# Patient Record
Sex: Female | Born: 1985 | Hispanic: Yes | Marital: Married | Smoking: Never smoker
Health system: Southern US, Community
[De-identification: ages and names within clinical notes are randomized; demographics above are authoritative.]

## PROBLEM LIST (undated history)

## (undated) DIAGNOSIS — Z789 Other specified health status: Secondary | ICD-10-CM

## (undated) DIAGNOSIS — O24419 Gestational diabetes mellitus in pregnancy, unspecified control: Secondary | ICD-10-CM

## (undated) HISTORY — DX: Gestational diabetes mellitus in pregnancy, unspecified control: O24.419

---

## 2015-01-04 ENCOUNTER — Encounter: Payer: Self-pay | Admitting: Emergency Medicine

## 2015-01-04 ENCOUNTER — Emergency Department
Admission: EM | Admit: 2015-01-04 | Discharge: 2015-01-05 | Disposition: A | Discharge status: Home / Self Care | Payer: PRIVATE HEALTH INSURANCE | Attending: Emergency Medicine | Admitting: Emergency Medicine

## 2015-01-04 DIAGNOSIS — Z3202 Encounter for pregnancy test, result negative: Secondary | ICD-10-CM | POA: Insufficient documentation

## 2015-01-04 DIAGNOSIS — R112 Nausea with vomiting, unspecified: Secondary | ICD-10-CM | POA: Insufficient documentation

## 2015-01-04 DIAGNOSIS — N390 Urinary tract infection, site not specified: Secondary | ICD-10-CM

## 2015-01-04 DIAGNOSIS — R1030 Lower abdominal pain, unspecified: Secondary | ICD-10-CM

## 2015-01-04 LAB — URINALYSIS COMPLETE WITH MICROSCOPIC (ARMC ONLY)
BILIRUBIN URINE: NEGATIVE
Bacteria, UA: NONE SEEN
GLUCOSE, UA: NEGATIVE mg/dL
HGB URINE DIPSTICK: NEGATIVE
Nitrite: NEGATIVE
PH: 5 (ref 5.0–8.0)
Protein, ur: 30 mg/dL — AB
Specific Gravity, Urine: 1.023 (ref 1.005–1.030)

## 2015-01-04 LAB — POCT PREGNANCY, URINE: Preg Test, Ur: NEGATIVE

## 2015-01-04 MED ORDER — DEXTROSE 5 % IV SOLN: 1.0000 g | INTRAVENOUS | Status: DC

## 2015-01-04 MED ORDER — SODIUM CHLORIDE 0.9 % IV BOLUS (SEPSIS)
1000.0000 mL | INTRAVENOUS | Status: AC
  Administered 2015-01-05: 1000 mL via INTRAVENOUS

## 2015-01-04 MED ORDER — CEFTRIAXONE SODIUM 1 G IJ SOLR
1.0000 g | INTRAMUSCULAR | Status: AC
  Administered 2015-01-05: 1 g via INTRAVENOUS
  Filled 2015-01-04: qty 10

## 2015-01-04 MED ORDER — ONDANSETRON HCL 4 MG/2ML IJ SOLN
4.0000 mg | Freq: Once | INTRAMUSCULAR | Status: AC
  Administered 2015-01-05: 4 mg via INTRAVENOUS
  Filled 2015-01-04: qty 2

## 2015-01-04 NOTE — ED Provider Notes (Signed)
[facility] Emergency Department Provider Note  ____________________________________________  Time seen: Approximately 11:35 PM  I have reviewed the triage vital signs and the nursing notes.   HISTORY  Chief Complaint Abdominal Pain    [facility] is a 29 y.o. female with no significant past medical history who presents with approximately 2 days of gradual onset but constant lower abdominal pain.  She states that she has had 3 episodes of vomiting today and was nauseated all day yesterday.  The pain is radiating around to her lower back as well.  She denies chest pain and shortness of breath.  She reports some subjective fever.  She denies anymedical problems or sick contacts recently.  Nothing makes the pain better and nothing makes it worse.  She also does endorse some mild to moderate burning dysuria and some dull aching suprapubic pain.   History reviewed. No pertinent past medical history.  There are no active problems to display for this patient.   History reviewed. No pertinent past surgical history.  Current Outpatient Rx  Name  Route  Sig  Dispense  Refill  . cephALEXin (KEFLEX) 500 MG capsule   Oral   Take 1 capsule (500 mg total) by mouth 3 (three) times daily.   36 capsule   0   . ondansetron (ZOFRAN) 4 MG tablet      Take 1-2 tabs by mouth every 8 hours as needed for nausea/vomiting   30 tablet   0     Allergies Review of patient's allergies indicates no known allergies.  History reviewed. No pertinent family history.  Social History Social History  Substance Use Topics  . Smoking status: Never Smoker   . Smokeless tobacco: None  . Alcohol Use: Yes    Review of Systems Constitutional: Subjective fever/chills Eyes: No visual changes. ENT: No sore throat. Cardiovascular: Denies chest pain. Respiratory: Denies shortness of breath. Gastrointestinal: Dull aching mild suprapubic pain and at least 3  episodes of vomiting.  No diarrhea.  No constipation. Genitourinary: Burning dysuria Musculoskeletal: Negative for back pain. Skin: Negative for rash. Neurological: Negative for headaches, focal weakness or numbness.  10-point ROS otherwise negative.  ____________________________________________   PHYSICAL EXAM:  VITAL SIGNS: ED Triage Vitals  Enc Vitals Group     BP 01/04/15 1900 125/79 mmHg     Pulse Rate 01/04/15 1900 59     Resp 01/04/15 1900 20     Temp 01/04/15 1900 98.6 F (37 C)     Temp Source 01/04/15 1900 Oral     SpO2 01/04/15 1900 97 %     Weight 01/04/15 1900 137 lb (62.143 kg)     Height 01/04/15 1900 5\' 4"  (1.626 m)     Head Cir --      Peak Flow --      Pain Score 01/04/15 1902 5     Pain Loc --      Pain Edu? --      Excl. in GC? --     Constitutional: Alert and oriented. Well appearing and in no acute distress. Eyes: Conjunctivae are normal. PERRL. EOMI. Head: Atraumatic. Nose: No congestion/rhinnorhea. Mouth/Throat: Mucous membranes are moist.  Oropharynx non-erythematous. Neck: No stridor.   Cardiovascular: Normal rate, regular rhythm. Grossly normal heart sounds.  Good peripheral circulation. Respiratory: Normal respiratory effort.  No retractions. Lungs CTAB. Gastrointestinal: Soft with mild tenderness to palpation of her suprapubic region.  There is no tenderness to palpation at McBurney's point and no  right upper quadrant tenderness. No distention. No abdominal bruits. No CVA tenderness. Musculoskeletal: No lower extremity tenderness nor edema.  No joint effusions. Neurologic:  Normal speech and language. No gross focal neurologic deficits are appreciated.  Skin:  Skin is warm, dry and intact. No rash noted. Psychiatric: Mood and affect are normal. Speech and behavior are normal.  ____________________________________________   LABS (all labs ordered are listed, but only abnormal results are displayed)  Labs Reviewed  URINALYSIS  COMPLETEWITH MICROSCOPIC ([facility] ONLY) - Abnormal; Notable for the following:    Color, Urine YELLOW (*)    APPearance HAZY (*)    Ketones, ur 2+ (*)    Protein, ur 30 (*)    Leukocytes, UA 2+ (*)    Squamous Epithelial / LPF 6-30 (*)    All other components within normal limits  CBC WITH DIFFERENTIAL/PLATELET - Abnormal; Notable for the following:    Basophils Absolute 0.3 (*)    All other components within normal limits  COMPREHENSIVE METABOLIC PANEL - Abnormal; Notable for the following:    Glucose, Bld 101 (*)    Creatinine, Ser 0.39 (*)    All other components within normal limits  URINE CULTURE  LIPASE, BLOOD  POC URINE PREG, ED  POCT PREGNANCY, URINE   ____________________________________________  EKG  Not indicated ____________________________________________  RADIOLOGY   No results found.  ____________________________________________   PROCEDURES  Procedure(s) performed: None  Critical Care performed: No ____________________________________________   INITIAL IMPRESSION / ASSESSMENT AND PLAN / ED COURSE  Pertinent labs & imaging results that were available during my care of the patient were reviewed by me and considered in my medical decision making (see chart for details).  The patient is well-appearing and afebrile in the emergency department.  Her urinalysis is notable for 2+ ketones as well as what appears to be a mild to moderate urinary tract infection.  Given her volume depletion in the setting of several abscesses of vomiting will give her some IV fluids while evaluating basic lab work.  ----------------------------------------- 12:47 AM on 01/05/2015 -----------------------------------------  Labs unremarkable, received ceftriaxone 1 g IV, discharged with usual and customary return precautions. ____________________________________________  FINAL CLINICAL IMPRESSION(S) / ED DIAGNOSES  Final diagnoses:  UTI (lower urinary tract infection)   Suprapubic abdominal pain, unspecified laterality  Non-intractable vomiting with nausea, vomiting of unspecified type      NEW MEDICATIONS STARTED DURING THIS VISIT:  New Prescriptions   CEPHALEXIN (KEFLEX) 500 MG CAPSULE    Take 1 capsule (500 mg total) by mouth 3 (three) times daily.   ONDANSETRON (ZOFRAN) 4 MG TABLET    Take 1-2 tabs by mouth every 8 hours as needed for nausea/vomiting     Loleta Rose, MD 01/05/15 [phone]

## 2015-01-04 NOTE — ED Notes (Signed)
Diffuse abdominal pain x 2 days. States does have burning with urination, nausea and vomiting and fever.

## 2015-01-04 NOTE — ED Notes (Signed)
Pt. States lower abdominal pain for the past 2 days.  Pt. States vomiting twice today.  Pt. Also states lower back pain.   Pt. Denies anyone else in household sick.  Pt. States painful sensation before urination.

## 2015-01-04 NOTE — ED Notes (Signed)
Report to matt martin, rn.  

## 2015-01-05 LAB — COMPREHENSIVE METABOLIC PANEL
ALBUMIN: 4.5 g/dL (ref 3.5–5.0)
ALT: 15 U/L (ref 14–54)
AST: 19 U/L (ref 15–41)
Alkaline Phosphatase: 69 U/L (ref 38–126)
Anion gap: 8 (ref 5–15)
BUN: 8 mg/dL (ref 6–20)
CHLORIDE: 104 mmol/L (ref 101–111)
CO2: 26 mmol/L (ref 22–32)
CREATININE: 0.39 mg/dL — AB (ref 0.44–1.00)
Calcium: 9.3 mg/dL (ref 8.9–10.3)
GFR calc Af Amer: >60 mL/min (ref >60)
GFR calc non Af Amer: >60 mL/min (ref >60)
GLUCOSE: 101 mg/dL — AB (ref 65–99)
POTASSIUM: 3.6 mmol/L (ref 3.5–5.1)
SODIUM: 138 mmol/L (ref 135–145)
Total Bilirubin: 0.9 mg/dL (ref 0.3–1.2)
Total Protein: 8 g/dL (ref 6.5–8.1)

## 2015-01-05 LAB — CBC WITH DIFFERENTIAL/PLATELET
BASOS ABS: 0.3 10*3/uL — AB (ref 0–0.1)
BASOS PCT: 3 %
EOS ABS: 0 10*3/uL (ref 0–0.7)
EOS PCT: 0 %
HCT: 39.2 % (ref 35.0–47.0)
Hemoglobin: 13.1 g/dL (ref 12.0–16.0)
LYMPHS PCT: 23 %
Lymphs Abs: 2.2 10*3/uL (ref 1.0–3.6)
MCH: 28.3 pg (ref 26.0–34.0)
MCHC: 33.5 g/dL (ref 32.0–36.0)
MCV: 84.3 fL (ref 80.0–100.0)
Monocytes Absolute: 0.6 10*3/uL (ref 0.2–0.9)
Monocytes Relative: 7 %
Neutro Abs: 6.4 10*3/uL (ref 1.4–6.5)
Neutrophils Relative %: 67 %
PLATELETS: 295 10*3/uL (ref 150–440)
RBC: 4.64 MIL/uL (ref 3.80–5.20)
RDW: 13.5 % (ref 11.5–14.5)
WBC: 9.5 10*3/uL (ref 3.6–11.0)

## 2015-01-05 LAB — LIPASE, BLOOD: Lipase: 20 U/L (ref 11–51)

## 2015-01-05 MED ORDER — ONDANSETRON HCL 4 MG PO TABS: ORAL_TABLET | ORAL | Status: DC

## 2015-01-05 MED ORDER — CEPHALEXIN 500 MG PO CAPS: 500.0000 mg | ORAL_CAPSULE | Freq: Three times a day (TID) | ORAL | Status: DC

## 2015-01-05 NOTE — ED Notes (Signed)
Pt. Going home with family. 

## 2015-01-05 NOTE — Discharge Instructions (Signed)
You have been seen in the Emergency Department (ED) today for pain when urinating and abdominal pain.  Your workup today suggests that you have a urinary tract infection (UTI).  Please take your antibiotic as prescribed and over-the-counter pain medication (Tylenol or Motrin) as needed, but no more than recommended on the label instructions.  Drink PLENTY of fluids.  Call your regular doctor to schedule the next available appointment to follow up on todays ED visit, or return immediately to the ED if your pain worsens, you have decreased urine production, develop fever, persistent vomiting, or other symptoms that concern you.   Infeccin urinaria  (Urinary Tract Infection)  La infeccin urinaria puede ocurrir en Corporate treasurer del tracto urinario. El tracto urinario es un sistema de drenaje del cuerpo por el que se eliminan los desechos y el exceso de [place]. El tracto urinario est formado por dos riones, dos urteres, la vejiga y Engineer, mining. Los riones son rganos que tienen forma de frijol. Cada rin tiene aproximadamente el tamao del puo. Estn situados debajo de las [place], uno a cada lado de la columna vertebral CAUSAS  La causa de la infeccin son los microbios, que son organismos microscpicos, que incluyen hongos, virus, y bacterias. Estos organismos son tan pequeos que slo pueden verse a travs del microscopio. Las bacterias son los microorganismos que ms comnmente causan infecciones urinarias.  SNTOMAS  Los sntomas pueden variar segn la edad y el sexo del paciente y por la ubicacin de la infeccin. Los sntomas en las mujeres jvenes incluyen la necesidad frecuente e intensa de orinar y una sensacin dolorosa de ardor en la vejiga o en la uretra durante la miccin. Las mujeres y los hombres mayores podrn sentir cansancio, temblores y debilidad y Futures trader musculares y Engineer, mining abdominal. Si tiene [place], puede significar que la infeccin est en los riones. Otros sntomas  son dolor en la espalda o en los lados debajo de las [place], nuseas y vmitos.  DIAGNSTICO  Para diagnosticar una infeccin urinaria, el mdico le preguntar acerca de sus sntomas. [employer] una [place] de [place]. La muestra de orina se analiza para Engineer, manufacturing bacterias y glbulos blancos de Risk manager. Los glbulos blancos se forman en el organismo para ayudar a Artist las infecciones.  TRATAMIENTO  Por lo general, las infecciones urinarias pueden tratarse con medicamentos. Debido a que la [employer]son de las infecciones son causadas por bacterias, por lo general pueden tratarse con antibiticos. La eleccin del antibitico y la duracin del tratamiento depender de sus sntomas y el tipo de bacteria causante de la infeccin.  INSTRUCCIONES PARA EL CUIDADO EN EL HOGAR   Si le recetaron antibiticos, tmelos exactamente como su mdico le indique. Termine el medicamento aunque se sienta mejor despus de haber tomado slo algunos.  Beba gran cantidad de lquido para mantener la orina de tono claro o color amarillo plido.  Evite la cafena, el t y las [address]. Estas sustancias irritan la vejiga.  Vaciar la vejiga con frecuencia. Evite retener la orina durante largos perodos.  Vace la vejiga antes y despus de Management consultant.  Despus de mover el intestino, las mujeres deben higienizarse la regin perineal desde adelante hacia atrs. Use slo un papel tissue por vez. SOLICITE ATENCIN MDICA SI:   Siente dolor en la espalda.  Le sube la fiebre.  Los sntomas no mejoran luego de [address]. SOLICITE ATENCIN MDICA DE INMEDIATO SI:   Siente dolor intenso en la espalda o en la zona  inferior del abdomen.  Comienza a sentir escalofros.  Tiene nuseas o vmitos.  Tiene una sensacin continua de quemazn o molestias al ConocoPhillipsorinar. ASEGRESE DE QUE:   Comprende estas instrucciones.  Controlar su enfermedad.  Solicitar ayuda de inmediato si no mejora o  empeora.   Esta informacin no tiene Theme park managercomo fin reemplazar el consejo del mdico. Asegrese de hacerle al mdico cualquier pregunta que tenga.   Document Released: 10/14/2004 Document Revised: 09/29/2011 Elsevier Interactive Patient Education 2016 Ar[employer]vier Inc.  SmithfieldUrocultivo y antibiograma (Urine Culture and Sensitivity Testing) POR QU ME DEBO REALIZAR ESTE ANLISIS?  El urocultivo es un anlisis que permite determinar si hay proliferacin de grmenes en la [place] River Terracemuestra de Slippery Rock Universityorina. Normalmente, no hay grmenes en la orina (estril). Por lo general, los grmenes presentes en la orina son bacterias. A veces, pueden ser hongos. Estos grmenes pueden causar una infeccin urinaria. Pueden hacerle este anlisis si tiene sntomas de infeccin urinaria. Estos pueden incluir los siguientes:  Ganas frecuentes de Geographical information systems officerorinar.  Ardor al Beatrix Shipperorinar. Si est embarazada, el mdico puede indicar que se haga este anlisis para evaluar si tiene una infeccin urinaria. Cuando es Welcomeevacuada, la orina pasa por el conducto a travs del cual se vaca la vejiga (uretra). En los hombres, la orina se evaca a travs de un orificio en la punta del pene. En las mujeres, la eliminacin se produce a travs de un conducto que est justo arriba de la abertura vaginal. Cerca de estas zonas, puede haber bacterias que normalmente viven en la piel (flora normal). QU TIPO DE MUESTRA SE TOMA? Para realizar un urocultivo, se debe recolectar una Mendenhallmuestra de orina de modo de evitar que la flora normal llegue a la Lakelandmuestra. El mtodo que se B[place]ausa con ms frecuencia se conoce como recoleccin de una muestra estril. En algunos casos, tal vez haya que recolectar la orina directamente de la vejiga por medio de una sonda delgada y flexible (catter). El mdico introduce el catter a travs de Network engineerla uretra hasta llegar a la vejiga. La muestra de orina se Scientific laboratory techniciancolocar en placas que contienen una sustancia que estimula la proliferacin de las bacterias (placas de agar).  Estas placas se conservan a temperatura ambiente durante un lapso de 24 a 48horas para comprobar si hay crecimiento de bacterias u otros grmenes. Luego, un tcnico de laboratorio las examina con un microscopio para Landscape architectdetectar la presencia de grmenes. Los grmenes que crezcan en el cultivo sern sometidos a pruebas frente a una variedad de medicamentos para determinar cul es el ms eficaz (antibiograma). En el caso de una infeccin urinaria causada por bacterias, se realizarn pruebas con varios tipos de antibiticos. CMO DEBO PREPARARME PARA ESTE ANLISIS?  No debe orinar durante una hora antes de la recoleccin de la Coramuestra.  Tome un vaso de agua aproximadamente 20minutos antes de la recoleccin de la Simmsmuestra.  Informe al mdico si ha estado tomando antibiticos, ya que estos pueden incidir en los Weslacoresultados del Thurmontanlisis. El mdico puede proporcionarle paos estriles para limpiar la vagina o el pene a fin de prepararse para recolectar una muestra estril. Para tomar la Ellinwoodmuestra, tendr que hacer lo siguiente: Mujeres y nias  Sintese en el inodoro y separe los labios de la vagina.  Con un pao, limpie la zona de la vagina de Camera operatoradelante hacia atrs.  Con un segundo pao, limpie el orificio de Engineer, miningla uretra.  Evacue una pequea cantidad de orina directamente en el inodoro mientras mantiene separados los labios de la vagina.  Luego, sostenga un  recipiente estril por debajo y orine dentro de [place].  Llene el recipiente hasta aproximadamente la mitad de su capacidad. Tpelo y devulvalo para su anlisis. Hombres y nios  Con un pao estril, limpie la punta del pene.  Evacue primero una pequea cantidad de orina directamente en el inodoro.  Luego, orine dentro del recipiente estril.  Llene el recipiente hasta aproximadamente la mitad de su capacidad. Tpelo y devulvalo para su anlisis. QU SIGNIFICAN LOS RESULTADOS? Los resultados del cultivo de [place] y el antibiograma sern  positivos o Audiological scientist.   Si crecen bastantes bacterias en la [place] de [place], se considera que el resultado del anlisis es positivo.  Si crecen muchas bacterias diferentes en la [place] de [place], tal vez el informe indique que el anlisis est contaminado.  Si no crecen bacterias en la muestra despus de 24 a 48horas, se considera que el resultado del anlisis es negativo.  Los [employer] del antibiograma le permiten al mdico saber qu medicamentos usar para tratar la infeccin. Si los resultados del cultivo de [place] son [place], significa lo siguiente:  Que es menos probable que tenga una infeccin urinaria.  Que se puede repetir el anlisis si los sntomas continan. Si los resultados del cultivo de [place] son positivos, significa lo siguiente:  Que es ms probable que tenga una infeccin urinaria.  Que tal vez deba comenzar un tratamiento en funcin de los [place] del antibiograma. Hable con el mdico [employer], las opciones de tratamiento y, si es necesario, la necesidad de Education officer, environmental ms [place]. Es su responsabilidad retirar el resultado del [place]. Consulte en el laboratorio o en el departamento en el que fue realizado el estudio cundo y cmo podr Starbucks Corporation. Hable con el mdico si tiene [employer]negy.   Esta informacin no tiene Theme park manager el consejo del mdico. Asegrese de hacerle al mdico cualquier pregunta que tenga.   Document Released: 10/25/2012 Document Revised: 01/25/2014 Elsevier Interactive Patient Education 2016 Arvin[employer]itor.  Dolor abdominal en adultos (Abdominal Pain, Adult) El dolor puede tener muchas causas. Normalmente la causa del dolor abdominal no es una enfermedad y Scientist, clinical (histocompatibility and immunogenetics) sin TEFL teacher. Frecuentemente puede controlarse y tratarse en casa. Su mdico le Medical sales representative examen fsico y posiblemente solicite anlisis de sangre y radiografas para ayudar a Chief Strategy Officer la gravedad de su dolor. Sin  embargo, en [employer]Inter[employer], debe transcurrir ms tiempo antes de que se pueda Clinical research associate una causa evidente del dolor. Antes de llegar a ese punto, es posible que su mdico no sepa si necesita ms pruebas o un tratamiento ms profundo. INSTRUCCIONES PARA EL CUIDADO EN EL HOGAR  Est atento al dolor para ver si hay cambios. Las siguientes indicaciones ayudarn a Architectural technologist que pueda sentir:  Rulo solo medicamentos de venta libre o recetados, segn las indicaciones del mdico.  No tome laxantes a menos que se lo haya indicado su mdico.  Pruebe con Neomia Dear dieta lquida absoluta (caldo, t o agua) segn se lo indique su mdico. Introduzca gradualmente una dieta normal, segn su tolerancia. SOLICITE ATENCIN MDICA SI:  Tiene dolor abdominal sin explicacin.  Tiene dolor abdominal relacionado con nuseas o diarrea.  Tiene dolor cuando orina o defeca.  Experimenta dolor abdominal que lo despierta de noche.  Tiene dolor abdominal que empeora o mejora cuando come alimentos.  Tiene dolor abdominal que empeora cuando come alimentos grasosos.  Tiene fiebre. SOLICITE ATENCIN MDICA DE INMEDIATO SI:   El dolor no desaparece en un plazo mximo de  2horas.  No deja de (vomitar).  El Engineer, mining se siente solo en partes del abdomen, como el lado derecho o la parte inferior izquierda del abdomen.  Evaca materia fecal sanguinolenta o negra, de aspecto alquitranado. ASEGRESE DE QUE:  Comprende estas instrucciones.  Controlar su afeccin.  Recibir ayuda de inmediato si no mejora o si empeora.   Esta informacin no tiene Theme park manager el consejo del mdico. Asegrese de hacerle al mdico cualquier pregunta que tenga.   Document Released: 01/04/2005 Document Revised: 01/25/2014 Elsevier Interactive Patient Education [employer]

## 2015-01-06 LAB — URINE CULTURE: SPECIAL REQUESTS: NORMAL

## 2015-09-07 ENCOUNTER — Observation Stay (HOSPITAL_COMMUNITY): Payer: PRIVATE HEALTH INSURANCE | Admitting: Certified Registered Nurse Anesthetist

## 2015-09-07 ENCOUNTER — Encounter (HOSPITAL_COMMUNITY): Payer: Self-pay | Admitting: Certified Registered"

## 2015-09-07 ENCOUNTER — Encounter (HOSPITAL_COMMUNITY): Admission: EM | Disposition: A | Discharge status: Home / Self Care | Payer: Self-pay | Source: Ambulatory Visit

## 2015-09-07 ENCOUNTER — Ambulatory Visit (HOSPITAL_COMMUNITY)
Admission: EM | Admit: 2015-09-07 | Discharge: 2015-09-07 | Disposition: A | Discharge status: Home / Self Care | Payer: PRIVATE HEALTH INSURANCE | Source: Ambulatory Visit | Attending: Orthopedic Surgery | Admitting: Orthopedic Surgery

## 2015-09-07 DIAGNOSIS — S52502A Unspecified fracture of the lower end of left radius, initial encounter for closed fracture: Secondary | ICD-10-CM | POA: Diagnosis present

## 2015-09-07 DIAGNOSIS — Z792 Long term (current) use of antibiotics: Secondary | ICD-10-CM | POA: Insufficient documentation

## 2015-09-07 DIAGNOSIS — W19XXXA Unspecified fall, initial encounter: Secondary | ICD-10-CM | POA: Diagnosis not present

## 2015-09-07 DIAGNOSIS — S52572A Other intraarticular fracture of lower end of left radius, initial encounter for closed fracture: Secondary | ICD-10-CM | POA: Diagnosis not present

## 2015-09-07 HISTORY — PX: ORIF WRIST FRACTURE: SHX2133

## 2015-09-07 LAB — I-STAT BETA HCG BLOOD, ED (MC, WL, AP ONLY): I-stat hCG, quantitative: <5 m[IU]/mL (ref <5)

## 2015-09-07 LAB — POCT I-STAT 4, (NA,K, GLUC, HGB,HCT)
Glucose, Bld: 85 mg/dL (ref 65–99)
HCT: 37 % (ref 36.0–46.0)
Hemoglobin: 12.6 g/dL (ref 12.0–15.0)
POTASSIUM: 3.4 mmol/L — AB (ref 3.5–5.1)
SODIUM: 143 mmol/L (ref 135–145)

## 2015-09-07 SURGERY — OPEN REDUCTION INTERNAL FIXATION (ORIF) WRIST FRACTURE
Anesthesia: Regional | Site: Arm Lower | Laterality: Left

## 2015-09-07 MED ORDER — LACTATED RINGERS IV SOLN
INTRAVENOUS | Status: DC | PRN
  Administered 2015-09-07 (×2): via INTRAVENOUS
  Administered 2015-09-07: 500 mL

## 2015-09-07 MED ORDER — 0.9 % SODIUM CHLORIDE (POUR BTL) OPTIME
TOPICAL | Status: DC | PRN
  Administered 2015-09-07: 1000 mL

## 2015-09-07 MED ORDER — PROMETHAZINE HCL 25 MG/ML IJ SOLN: 6.2500 mg | INTRAMUSCULAR | Status: DC | PRN

## 2015-09-07 MED ORDER — ONDANSETRON HCL 4 MG/2ML IJ SOLN
INTRAMUSCULAR | Status: DC | PRN
  Administered 2015-09-07: 4 mg via INTRAVENOUS

## 2015-09-07 MED ORDER — CEFAZOLIN SODIUM-DEXTROSE 2-3 GM-% IV SOLR
INTRAVENOUS | Status: DC | PRN
  Administered 2015-09-07: 2 g via INTRAVENOUS

## 2015-09-07 MED ORDER — MIDAZOLAM HCL 2 MG/2ML IJ SOLN
INTRAMUSCULAR | Status: AC
  Filled 2015-09-07: qty 2

## 2015-09-07 MED ORDER — HYDROMORPHONE HCL 1 MG/ML IJ SOLN: 0.2500 mg | INTRAMUSCULAR | Status: DC | PRN

## 2015-09-07 MED ORDER — FENTANYL CITRATE (PF) 100 MCG/2ML IJ SOLN
INTRAMUSCULAR | Status: DC | PRN
  Administered 2015-09-07 (×2): 50 ug via INTRAVENOUS

## 2015-09-07 MED ORDER — BUPIVACAINE-EPINEPHRINE (PF) 0.5% -1:200000 IJ SOLN
INTRAMUSCULAR | Status: DC | PRN
  Administered 2015-09-07: 30 mL via PERINEURAL

## 2015-09-07 MED ORDER — BUPIVACAINE HCL (PF) 0.25 % IJ SOLN
INTRAMUSCULAR | Status: AC
  Filled 2015-09-07: qty 30

## 2015-09-07 MED ORDER — HYDROCODONE-ACETAMINOPHEN 7.5-325 MG PO TABS: 1.0000 | ORAL_TABLET | Freq: Once | ORAL | Status: DC | PRN

## 2015-09-07 MED ORDER — OXYCODONE-ACETAMINOPHEN 5-325 MG PO TABS: 1.0000 | ORAL_TABLET | Freq: Four times a day (QID) | ORAL | 0 refills | Status: DC | PRN

## 2015-09-07 MED ORDER — BUPIVACAINE HCL (PF) 0.25 % IJ SOLN
INTRAMUSCULAR | Status: DC | PRN
  Administered 2015-09-07: 10 mL

## 2015-09-07 MED ORDER — FENTANYL CITRATE (PF) 100 MCG/2ML IJ SOLN
INTRAMUSCULAR | Status: AC
  Filled 2015-09-07: qty 4

## 2015-09-07 MED ORDER — PROPOFOL 10 MG/ML IV BOLUS
INTRAVENOUS | Status: DC | PRN
  Administered 2015-09-07: 200 mg via INTRAVENOUS

## 2015-09-07 MED ORDER — LIDOCAINE HCL (CARDIAC) 20 MG/ML IV SOLN
INTRAVENOUS | Status: DC | PRN
  Administered 2015-09-07: 20 mg via INTRATRACHEAL

## 2015-09-07 MED ORDER — FENTANYL CITRATE (PF) 100 MCG/2ML IJ SOLN
INTRAMUSCULAR | Status: AC
  Filled 2015-09-07: qty 2

## 2015-09-07 MED ORDER — KETOROLAC TROMETHAMINE 30 MG/ML IJ SOLN: 30.0000 mg | Freq: Once | INTRAMUSCULAR | Status: DC | PRN

## 2015-09-07 MED ORDER — MIDAZOLAM HCL 2 MG/2ML IJ SOLN
INTRAMUSCULAR | Status: DC | PRN
  Administered 2015-09-07: 2 mg via INTRAVENOUS

## 2015-09-07 MED ORDER — PROPOFOL 10 MG/ML IV BOLUS
INTRAVENOUS | Status: AC
  Filled 2015-09-07: qty 20

## 2015-09-07 SURGICAL SUPPLY — 65 items
BANDAGE ACE 3X5.8 VEL STRL LF (GAUZE/BANDAGES/DRESSINGS) ×3 IMPLANT
BANDAGE ELASTIC 3 VELCRO ST LF (GAUZE/BANDAGES/DRESSINGS) ×3 IMPLANT
BANDAGE ELASTIC 4 VELCRO ST LF (GAUZE/BANDAGES/DRESSINGS) ×3 IMPLANT
BIT DRILL 1.6 QC (BIT) IMPLANT
BIT DRILL 2.2 SS TIBIAL (BIT) ×3 IMPLANT
BLADE SURG ROTATE 9660 (MISCELLANEOUS) IMPLANT
BNDG ESMARK 4X9 LF (GAUZE/BANDAGES/DRESSINGS) ×3 IMPLANT
BNDG GAUZE ELAST 4 BULKY (GAUZE/BANDAGES/DRESSINGS) ×3 IMPLANT
CLOSURE STERI-STRIP 1/2X4 (GAUZE/BANDAGES/DRESSINGS) ×1
CLSR STERI-STRIP ANTIMIC 1/2X4 (GAUZE/BANDAGES/DRESSINGS) ×2 IMPLANT
COVER SURGICAL LIGHT HANDLE (MISCELLANEOUS) ×3 IMPLANT
CUFF TOURNIQUET SINGLE 18IN (TOURNIQUET CUFF) ×3 IMPLANT
CUFF TOURNIQUET SINGLE 24IN (TOURNIQUET CUFF) IMPLANT
DECANTER SPIKE VIAL GLASS SM (MISCELLANEOUS) ×6 IMPLANT
DRAPE OEC MINIVIEW 54X84 (DRAPES) ×3 IMPLANT
DRAPE U-SHAPE 47X51 STRL (DRAPES) ×3 IMPLANT
ELECT REM PT RETURN 9FT ADLT (ELECTROSURGICAL) ×3
ELECTRODE REM PT RTRN 9FT ADLT (ELECTROSURGICAL) ×1 IMPLANT
GAUZE SPONGE 4X4 12PLY STRL (GAUZE/BANDAGES/DRESSINGS) ×3 IMPLANT
GAUZE XEROFORM 1X8 LF (GAUZE/BANDAGES/DRESSINGS) ×3 IMPLANT
GAUZE XEROFORM 5X9 LF (GAUZE/BANDAGES/DRESSINGS) ×3 IMPLANT
GLOVE BIO SURGEON STRL SZ7.5 (GLOVE) ×9 IMPLANT
GLOVE BIOGEL PI IND STRL 8 (GLOVE) ×3 IMPLANT
GLOVE BIOGEL PI INDICATOR 8 (GLOVE) ×6
GOWN STRL REUS W/ TWL LRG LVL3 (GOWN DISPOSABLE) ×1 IMPLANT
GOWN STRL REUS W/ TWL XL LVL3 (GOWN DISPOSABLE) ×2 IMPLANT
GOWN STRL REUS W/TWL LRG LVL3 (GOWN DISPOSABLE) ×2
GOWN STRL REUS W/TWL XL LVL3 (GOWN DISPOSABLE) ×4
KIT BASIN OR (CUSTOM PROCEDURE TRAY) ×3 IMPLANT
KIT ROOM TURNOVER OR (KITS) ×3 IMPLANT
MANIFOLD NEPTUNE II (INSTRUMENTS) ×3 IMPLANT
NEEDLE 22X1 1/2 (OR ONLY) (NEEDLE) IMPLANT
NEEDLE HYPO 25GX1X1/2 BEV (NEEDLE) ×3 IMPLANT
NS IRRIG 1000ML POUR BTL (IV SOLUTION) ×3 IMPLANT
PACK ORTHO EXTREMITY (CUSTOM PROCEDURE TRAY) ×3 IMPLANT
PAD ARMBOARD 7.5X6 YLW CONV (MISCELLANEOUS) ×6 IMPLANT
PAD CAST 4YDX4 CTTN HI CHSV (CAST SUPPLIES) ×1 IMPLANT
PADDING CAST COTTON 4X4 STRL (CAST SUPPLIES) ×2
PEG LOCKING SMOOTH 2.2X14 (Peg) ×3 IMPLANT
PEG LOCKING SMOOTH 2.2X18 (Peg) ×9 IMPLANT
PEG LOCKING SMOOTH 2.2X20 (Screw) ×6 IMPLANT
PEG LOCKING SMOOTH 2.2X22 (Screw) ×3 IMPLANT
PLATE NARROW DVR LEFT (Plate) ×3 IMPLANT
SCREW LOCK 10X2.7X3 LD THRD (Screw) ×1 IMPLANT
SCREW LOCK 14X2.7X 3 LD TPR (Screw) ×1 IMPLANT
SCREW LOCKING 2.7X10MM (Screw) ×2 IMPLANT
SCREW LOCKING 2.7X13MM (Screw) ×3 IMPLANT
SCREW LOCKING 2.7X14 (Screw) ×2 IMPLANT
SLING ARM FOAM STRAP MED (SOFTGOODS) ×3 IMPLANT
SPLINT FIBERGLASS 4X15 (CAST SUPPLIES) ×3 IMPLANT
SPONGE GAUZE 4X4 12PLY STER LF (GAUZE/BANDAGES/DRESSINGS) ×3 IMPLANT
SPONGE LAP 4X18 X RAY DECT (DISPOSABLE) ×3 IMPLANT
SUCTION FRAZIER HANDLE 10FR (MISCELLANEOUS) ×2
SUCTION TUBE FRAZIER 10FR DISP (MISCELLANEOUS) ×1 IMPLANT
SUT ETHILON 4 0 PS 2 18 (SUTURE) ×3 IMPLANT
SUT MNCRL AB 4-0 PS2 18 (SUTURE) ×3 IMPLANT
SUT VIC AB 2-0 FS1 27 (SUTURE) ×3 IMPLANT
SUT VIC AB 4-0 PS2 27 (SUTURE) ×3 IMPLANT
SYR CONTROL 10ML LL (SYRINGE) ×3 IMPLANT
TOWEL OR 17X24 6PK STRL BLUE (TOWEL DISPOSABLE) ×3 IMPLANT
TOWEL OR 17X26 10 PK STRL BLUE (TOWEL DISPOSABLE) ×3 IMPLANT
TUBE CONNECTING 12'X1/4 (SUCTIONS) ×1
TUBE CONNECTING 12X1/4 (SUCTIONS) ×2 IMPLANT
WATER STERILE IRR 1000ML POUR (IV SOLUTION) ×3 IMPLANT
WIRE K 1.6MM 144256 (MISCELLANEOUS) ×3 IMPLANT

## 2015-09-07 NOTE — Brief Op Note (Signed)
09/07/2015  1:33 PM  PATIENT:  Stacy [facility] Beatriz Madden Madden  30 y.o. female  PRE-OPERATIVE DIAGNOSIS:  wrist fx  POST-OPERATIVE DIAGNOSIS:  wrist fx  PROCEDURE:  Procedure(s): OPEN REDUCTION INTERNAL FIXATION (ORIF) WRIST FRACTURE AND CARPAL TUNNEL RELEASE (Left)  SURGEON:  Surgeon(s) and Role:    * Jodi GeraldsJohn Kimorah Ridolfi, MD - Primary  PHYSICIAN ASSISTANT:   ASSISTANTS: bethune   ANESTHESIA:   general  EBL:  Total I/O In: 1240 [P.O.:240; I.V.:1000] Out: 50 [Blood:50]  BLOOD ADMINISTERED:none  DRAINS: none   LOCAL MEDICATIONS USED:  MARCAINE     SPECIMEN:  No Specimen  DISPOSITION OF SPECIMEN:  N/A  COUNTS:  YES  TOURNIQUET:   Total Tourniquet Time Documented: Upper Arm (Left) - 81 minutes Total: Upper Arm (Left) - 81 minutes   DICTATION: .Other Dictation: Dictation Number none given  PLAN OF CARE: Discharge to home after PACU  PATIENT DISPOSITION:  PACU - hemodynamically stable.   Delay start of Pharmacological VTE agent (>24hrs) due to surgical blood loss or risk of bleeding: no

## 2015-09-07 NOTE — H&P (Signed)
  PREOPERATIVE H&P  Chief Complaint: Left wrist pain  HPI: Stacy Madden is a 30 y.o. female who presents for evaluation of left wrist pain. It has been present for 10 days and has been worsening. She has failed conservative measures. Pain is rated as moderate.  No past medical history on file. No past surgical history on file. Social History   Social History  . Marital status: Single    Spouse name: N/A  . Number of children: N/A  . Years of education: N/A   Social History Main Topics  . Smoking status: Never Smoker  . Smokeless tobacco: Not on file  . Alcohol use Yes  . Drug use: No  . Sexual activity: No   Other Topics Concern  . Not on file   Social History Narrative  . No narrative on file   No family history on file. No Known Allergies Prior to Admission medications   Medication Sig Start Date End Date Taking? Authorizing Provider  cephALEXin (KEFLEX) 500 MG capsule Take 1 capsule (500 mg total) by mouth 3 (three) times daily. 01/05/15   Loleta Roseory Forbach, MD  ondansetron [facility](ZOFRAN) 4 MG tablet Take 1-2 tabs by mouth every 8 hours as needed for nausea/vomiting 01/05/15   Loleta Roseory Forbach, MD     Positive ROS: None  All other systems have been reviewed and were otherwise negative with the exception of those mentioned in the HPI and as above.  Physical Exam: There were no vitals filed for this visit. There were no vitals filed for this visit. General: Alert, no acute distress Cardiovascular: No pedal edema Respiratory: No cyanosis, no use of accessory musculature GI: No organomegaly, abdomen is soft and non-tender Skin: No lesions in the area of chief complaint Neurologic: Sensation intact distally Psychiatric: Patient is competent for consent with normal mood and affect Lymphatic: No axillary or cervical lymphadenopathy    MUSCULOSKELETAL: Left wrist: Painful range of motion. Limited range of motion. Neurovascularly intact distally.   X-ray: X-ray  shows displaced intra-articular distal radius fracture. Assessment/Plan: wrist fx Plan for Procedure(s): OPEN REDUCTION INTERNAL FIXATION (ORIF) WRIST FRACTURE  The risks benefits and alternatives were discussed with the patient including but not limited to the risks of nonoperative treatment, versus surgical intervention including infection, bleeding, nerve injury, malunion, nonunion, hardware prominence, hardware failure, need for hardware removal, blood clots, cardiopulmonary complications, morbidity, mortality, among others, and they were willing to proceed.  Predicted outcome is good, although there will be at least a six to nine month expected recovery.  Shajuan Musso L, MD 09/07/2015 7:41 AM

## 2015-09-07 NOTE — Anesthesia Preprocedure Evaluation (Signed)
Anesthesia Evaluation  Patient identified by MRN, date of birth, ID band Patient awake    Reviewed: Allergy & Precautions, NPO status , Patient's Chart, lab work & pertinent test results  Airway Mallampati: II  TM Distance: >3 FB Neck ROM: Full    Dental  (+) Dental Advisory Given   Pulmonary neg pulmonary ROS,    breath sounds clear to auscultation       Cardiovascular negative cardio ROS   Rhythm:Regular Rate:Normal     Neuro/Psych negative neurological ROS     GI/Hepatic negative GI ROS, Neg liver ROS,   Endo/Other  negative endocrine ROS  Renal/GU negative Renal ROS     Musculoskeletal   Abdominal   Peds  Hematology negative hematology ROS (+)   Anesthesia Other Findings   Reproductive/Obstetrics                             Anesthesia Physical Anesthesia Plan  ASA: I  Anesthesia Plan: General and Regional   Post-op Pain Management: GA combined w/ Regional for post-op pain   Induction: Intravenous  Airway Management Planned: LMA  Additional Equipment:   Intra-op Plan:   Post-operative Plan: Extubation in OR  Informed Consent: I have reviewed the patients History and Physical, chart, labs and discussed the procedure including the risks, benefits and alternatives for the proposed anesthesia with the patient or authorized representative who has indicated his/her understanding and acceptance.   Dental advisory given  Plan Discussed with: CRNA  Anesthesia Plan Comments:         Anesthesia Quick Evaluation

## 2015-09-07 NOTE — Op Note (Signed)
NAMPasty Madden:  VASQUEZ VASQUEZ, Franca       ACCOUNT NO.:  [id number]  MEDICAL RECORD NO.:  [id number]  LOCATION:  OTFC                         FACILITY:  [facility]  PHYSICIAN:  Harvie JuniorJohn L. Teagen Mcleary, M.D.   DATE OF BIRTH:  06-08-1985  DATE OF PROCEDURE:  09/07/2015 DATE OF DISCHARGE:  09/07/2015                              OPERATIVE REPORT   PREOPERATIVE DIAGNOSIS:  Distal radius intra-articular fracture about 2810 days old.  POSTOPERATIVE DIAGNOSIS:  Distal radius intra-articular fracture about 4510 days old.  PROCEDURE: 1. Open reduction and internal fixation of distal radius fracture with     fixation of greater than 3 intra-articular fragments. 2. Carpal tunnel release. 3. Interpretation of multiple intraoperative fluoroscopic images.  SURGEON:  Harvie JuniorJohn L. Jayona Mccaig, M.D.  ASSISTANT:  Marshia LyJames Bethune, P.A.  ANESTHESIA:  General.  BRIEF HISTORY:  Ms. Stacy ArchMercy Vasquez Vasquez is a 30 year old female with a history of fall about a week ago.  She ultimately presented to our Urgent Care with a displaced intra-articular fracture.  They consulted me, I looked at the x-rays and felt that she needed open reduction and internal fixation.  She then came to my office where we discussed the procedure with her and she was ultimately taken to the operating room for open reduction and internal fixation on a semi urgent basis given the length of time the fracture had been there.  We discussed with her the need for potentially significant surgery given the amount of healing that had taken place.  She understood this when she came to the operating room.  DESCRIPTION OF PROCEDURE:  The patient was taken to the operating room. After adequate anesthesia was obtained with general anesthetic, the patient was placed supine on the operating table.  The left arm was then prepped and draped in usual sterile fashion.  Following this, the arm was exsanguinated, blood pressure tourniquet was insufflated to 250 mmHg.  Following  this, an incision was made just over the FCR tendon. The FCR tendon was retracted radially.  The floor of the tendon was explored and attention was turned to the radial side where we have released the pronator quadratus and exposed the fracture fragments on the front side.  At this point, we felt that there was some significant issues with the posterior reduction and we needed to dissect aggressively around the posterior aspect of the radius.  We went right on bone and released the remaining portion of the pronator quadratus, and once this was done we did a manipulative closed reduction of the dorsal aspect of the radius, and then we were able to get a manipulative closed reduction.  We were able to kind of hold this reduction in place at this point, and once that was done, we were able to place the DVR plate right on the volar aspect to the bone.  We then locked it in place and then adjusted it for perfect positioning, and then while we were holding the DVR plate in place, we then put the distal smooth pegs in place and then we put the remainder of the proximal screws in place. Anatomic reduction was achieved and we were able to get a final fluoroscopic images showing anatomic reduction of distal radius fracture and at  this point, the wounds were irrigated, suctioned dry, and we closed the pronator quadratus with 4-0 Vicryl interrupted sutures, we then closed the skin with 2-0 Vicryl and 3-0 Monocryl subcuticular stitches.  Benzoin and Steri-Strips were applied.  Sterile compressive dressing was applied.  The patient was taken to the recovery where she was noted to be in satisfactory condition.  The estimated blood loss for the procedure is minimal.  The patient was placed into a volar splint and taken to recovery where she was noted to be in satisfactory condition.Harvie Junior.     Sharran Caratachea L. Bhavin Monjaraz, M.D.     Ranae PlumberJLG/MEDQ  D:  09/07/2015  T:  09/07/2015  Job:  [id number]

## 2015-09-07 NOTE — Anesthesia Procedure Notes (Addendum)
Performed by: Isidore MoosPAXTON, Obe Ahlers A Oxygen Delivery Method: Circle system utilized Preoxygenation: Pre-oxygenation with 100% oxygen Intubation Type: IV induction Ventilation: Mask ventilation without difficulty LMA: LMA inserted LMA Size: 4.0 Comments: Bridge removed after induction and before airway manipulation.

## 2015-09-07 NOTE — Anesthesia Postprocedure Evaluation (Signed)
Anesthesia Post Note  Patient: Sonnie AlamoMercy Beatriz Vasquez Vasquez  Procedure(s) Performed: Procedure(s) (LRB): OPEN REDUCTION INTERNAL FIXATION (ORIF) WRIST FRACTURE AND CARPAL TUNNEL RELEASE (Left)  Patient location during evaluation: PACU Anesthesia Type: General and Regional Level of consciousness: awake and alert Pain management: pain level controlled Vital Signs Assessment: post-procedure vital signs reviewed and stable Respiratory status: spontaneous breathing, nonlabored ventilation, respiratory function stable and patient connected to nasal cannula oxygen Cardiovascular status: blood pressure returned to baseline and stable Postop Assessment: no signs of nausea or vomiting Anesthetic complications: no    Last Vitals:  Vitals:   09/07/15 0808 09/07/15 1023  BP:  123/72  Pulse: (!) 110 91  Resp: 17 19    Last Pain: There were no vitals filed for this visit.               Kennieth RadFitzgerald, Emmily Pellegrin E

## 2015-09-07 NOTE — Transfer of Care (Signed)
Immediate Anesthesia Transfer of Care Note  Patient: Stacy AlamoMercy Beatriz Vasquez Vasquez  Procedure(s) Performed: Procedure(s): OPEN REDUCTION INTERNAL FIXATION (ORIF) WRIST FRACTURE AND CARPAL TUNNEL RELEASE (Left)  Patient Location: PACU  Anesthesia Type:General and Regional  Level of Consciousness: awake, alert  and oriented  Airway & Oxygen Therapy: Patient Spontanous Breathing and Patient connected to nasal cannula oxygen  Post-op Assessment: Report given to RN and Post -op Vital signs reviewed and stable  Post vital signs: Reviewed and stable  Last Vitals:  Vitals:   09/07/15 0807 09/07/15 0808  Pulse: (!) 105 (!) 110  Resp: 17 17    Last Pain: There were no vitals filed for this visit.       Complications: No apparent anesthesia complications

## 2015-09-07 NOTE — Discharge Instructions (Signed)
Wear sling for comfort. Elevate your left hand as much as possible. Apply ice for 2 or 3 days. You may move your fingers as tolerated.

## 2015-09-07 NOTE — Anesthesia Procedure Notes (Addendum)
Anesthesia Regional Block:  Supraclavicular block  Pre-Anesthetic Checklist: ,, timeout performed, Correct Patient, Correct Site, Correct Laterality, Correct Procedure, Correct Position, site marked, Risks and benefits discussed,  Surgical consent,  Pre-op evaluation,  At surgeon's request and post-op pain management  Laterality: Left  Prep: chloraprep       Needles:  Injection technique: Single-shot  Needle Type: Echogenic Stimulator Needle     Needle Length: 9cm 9 cm Needle Gauge: 21 G    Additional Needles:  Procedures: ultrasound guided (picture in chart) and nerve stimulator Supraclavicular block  Nerve Stimulator or Paresthesia:  Response: triceps and deltoid, 0.5 mA,   Additional Responses:   Narrative:  Start time: 09/07/2015 7:55 AM End time: 09/07/2015 8:02 AM Injection made incrementally with aspirations every 5 mL.  Performed by: Personally  Anesthesiologist: Marcene DuosFITZGERALD, Nirvaan Frett  Additional Notes: Risks, benefits and alternative to block explained extensively.  Patient tolerated procedure well, without complications.

## 2015-09-09 ENCOUNTER — Encounter (HOSPITAL_COMMUNITY): Payer: Self-pay | Admitting: Orthopedic Surgery

## 2015-09-15 ENCOUNTER — Encounter (HOSPITAL_COMMUNITY): Payer: Self-pay | Admitting: Orthopedic Surgery

## 2017-01-18 NOTE — L&D Delivery Note (Signed)
Delivery Note  First Stage: Labor onset: 0100 01/07/18 Augmentation : Pitocin Analgesia Eliezer Lofts/Anesthesia intrapartum: stadol, epidural SROM at 1500 01/07/18  Second Stage: Complete dilation: 01/07/18 at 2313 Onset of pushing at 2318 FHR second stage cat I, 160bpm, moderate variability  Delivery of a viable female on 01/08/2018 at 0035 by Heloise Ochoaebecca McVey CNM delivery of fetal head in ROA position with restitution to ROT. No nuchal cord;  Anterior then posterior shoulders delivered easily with gentle downward traction. Baby placed on mom's chest, and attended to by peds.  Cord double clamped after cessation of pulsation, cut by FOB Cord blood sample collected   Third Stage: Placenta delivered spontaneously intact with 3VC @ 0048 Placenta disposition: routine disposal Uterine tone firm / bleeding brisk, Pitocin bolus infusing, cytotec given. 1st deg lac repaired and hemostatic, Brisk vaginal bleeding noted, small clots in LUS- evacuated, bimanual compression performed with good hemostasis. Cervix, vagina examined- no significant lacerations noted, brisk bleeding resumed and again massaged firm. Foley cath placed. Hemabate 250mcg IM given. Left periurethral lac repaired. Fundus checked again, noted to be U/2 and firm with small amt dark lochia.   1st deg perineal lac and left periurethral lac identified and repaired.  Anesthesia for repair: epidral Repair: 2-0 Vicryl CT-1 and 3-0 Vicryl SH.  Est. Blood Loss (mL): 450ml  Complications: GHTN, brisk immediate PP bleeding with total EBL 450ml  Mom to postpartum.  Baby to Couplet care / Skin to Skin.  Newborn: Birth Weight: pending  Apgar Scores: 8/9 Feeding planned: breast

## 2017-06-01 LAB — OB RESULTS CONSOLE RUBELLA ANTIBODY, IGM: Rubella: IMMUNE

## 2017-06-01 LAB — OB RESULTS CONSOLE HEPATITIS B SURFACE ANTIGEN: Hepatitis B Surface Ag: NEGATIVE

## 2017-06-01 LAB — OB RESULTS CONSOLE RPR: RPR: NONREACTIVE

## 2017-06-01 LAB — OB RESULTS CONSOLE VARICELLA ZOSTER ANTIBODY, IGG: Varicella: IMMUNE

## 2017-07-15 ENCOUNTER — Other Ambulatory Visit: Payer: Self-pay | Admitting: Family Medicine

## 2017-07-15 DIAGNOSIS — Z3482 Encounter for supervision of other normal pregnancy, second trimester: Secondary | ICD-10-CM

## 2017-07-27 ENCOUNTER — Ambulatory Visit: Payer: No Typology Code available for payment source

## 2017-10-27 LAB — OB RESULTS CONSOLE HIV ANTIBODY (ROUTINE TESTING): HIV: NONREACTIVE

## 2017-11-24 LAB — OB RESULTS CONSOLE GC/CHLAMYDIA
Chlamydia: NEGATIVE
GC PROBE AMP, GENITAL: NEGATIVE

## 2017-12-22 ENCOUNTER — Other Ambulatory Visit: Payer: Self-pay | Admitting: Family Medicine

## 2017-12-23 LAB — OB RESULTS CONSOLE GBS: STREP GROUP B AG: NEGATIVE

## 2017-12-26 ENCOUNTER — Other Ambulatory Visit: Payer: Self-pay | Admitting: Family Medicine

## 2017-12-26 DIAGNOSIS — O09899 Supervision of other high risk pregnancies, unspecified trimester: Secondary | ICD-10-CM

## 2017-12-28 ENCOUNTER — Other Ambulatory Visit: Payer: Self-pay | Admitting: Family Medicine

## 2017-12-28 ENCOUNTER — Ambulatory Visit
Admission: RE | Admit: 2017-12-28 | Discharge: 2017-12-28 | Disposition: A | Discharge status: Home / Self Care | Payer: PRIVATE HEALTH INSURANCE | Source: Ambulatory Visit | Attending: Family Medicine | Admitting: Family Medicine

## 2017-12-28 DIAGNOSIS — O09893 Supervision of other high risk pregnancies, third trimester: Secondary | ICD-10-CM | POA: Diagnosis present

## 2017-12-28 DIAGNOSIS — O321XX0 Maternal care for breech presentation, not applicable or unspecified: Secondary | ICD-10-CM

## 2017-12-28 DIAGNOSIS — O09899 Supervision of other high risk pregnancies, unspecified trimester: Secondary | ICD-10-CM

## 2017-12-28 DIAGNOSIS — Z3A36 36 weeks gestation of pregnancy: Secondary | ICD-10-CM | POA: Insufficient documentation

## 2018-01-02 ENCOUNTER — Other Ambulatory Visit: Payer: Self-pay | Admitting: Certified Nurse Midwife

## 2018-01-02 NOTE — Progress Notes (Signed)
IOL orders for admit 

## 2018-01-07 ENCOUNTER — Inpatient Hospital Stay: Payer: Medicaid Other | Admitting: Anesthesiology

## 2018-01-07 ENCOUNTER — Other Ambulatory Visit: Payer: Self-pay

## 2018-01-07 ENCOUNTER — Inpatient Hospital Stay
Admission: EM | Admit: 2018-01-07 | Discharge: 2018-01-10 | DRG: 805 | Disposition: A | Discharge status: Home / Self Care | Payer: Medicaid Other | Attending: Obstetrics and Gynecology | Admitting: Obstetrics and Gynecology

## 2018-01-07 DIAGNOSIS — O24425 Gestational diabetes mellitus in childbirth, controlled by oral hypoglycemic drugs: Secondary | ICD-10-CM | POA: Diagnosis present

## 2018-01-07 DIAGNOSIS — O24415 Gestational diabetes mellitus in pregnancy, controlled by oral hypoglycemic drugs: Secondary | ICD-10-CM | POA: Diagnosis present

## 2018-01-07 DIAGNOSIS — O134 Gestational [pregnancy-induced] hypertension without significant proteinuria, complicating childbirth: Principal | ICD-10-CM | POA: Diagnosis present

## 2018-01-07 DIAGNOSIS — Z3A39 39 weeks gestation of pregnancy: Secondary | ICD-10-CM | POA: Diagnosis not present

## 2018-01-07 DIAGNOSIS — O411230 Chorioamnionitis, third trimester, not applicable or unspecified: Secondary | ICD-10-CM | POA: Diagnosis present

## 2018-01-07 HISTORY — DX: Other specified health status: Z78.9

## 2018-01-07 LAB — GLUCOSE, CAPILLARY
GLUCOSE-CAPILLARY: 89 mg/dL (ref 70–99)
Glucose-Capillary: 77 mg/dL (ref 70–99)
Glucose-Capillary: 92 mg/dL (ref 70–99)
Glucose-Capillary: 123 mg/dL — ABNORMAL HIGH (ref 70–99)

## 2018-01-07 LAB — CBC
HCT: 37.3 % (ref 36.0–46.0)
Hemoglobin: 12.9 g/dL (ref 12.0–15.0)
MCH: 30 pg (ref 26.0–34.0)
MCHC: 34.6 g/dL (ref 30.0–36.0)
MCV: 86.7 fL (ref 80.0–100.0)
Platelets: 220 10*3/uL (ref 150–400)
RBC: 4.3 MIL/uL (ref 3.87–5.11)
RDW: 13.6 % (ref 11.5–15.5)
WBC: 10.6 10*3/uL — ABNORMAL HIGH (ref 4.0–10.5)
nRBC: 0 % (ref 0.0–0.2)

## 2018-01-07 LAB — COMPREHENSIVE METABOLIC PANEL
ALT: 26 U/L (ref 0–44)
AST: 28 U/L (ref 15–41)
Albumin: 3.3 g/dL — ABNORMAL LOW (ref 3.5–5.0)
Alkaline Phosphatase: 221 U/L — ABNORMAL HIGH (ref 38–126)
Anion gap: 9 (ref 5–15)
BILIRUBIN TOTAL: 0.6 mg/dL (ref 0.3–1.2)
BUN: 10 mg/dL (ref 6–20)
CO2: 18 mmol/L — ABNORMAL LOW (ref 22–32)
CREATININE: 0.45 mg/dL (ref 0.44–1.00)
Calcium: 9.2 mg/dL (ref 8.9–10.3)
Chloride: 109 mmol/L (ref 98–111)
GFR calc Af Amer: >60 mL/min (ref >60)
GFR calc non Af Amer: >60 mL/min (ref >60)
GLUCOSE: 92 mg/dL (ref 70–99)
Potassium: 3.6 mmol/L (ref 3.5–5.1)
Sodium: 136 mmol/L (ref 135–145)
Total Protein: 6.6 g/dL (ref 6.5–8.1)

## 2018-01-07 LAB — TYPE AND SCREEN
ABO/RH(D): O POS
Antibody Screen: NEGATIVE

## 2018-01-07 LAB — PROTEIN / CREATININE RATIO, URINE
Creatinine, Urine: <10 mg/dL
Total Protein, Urine: <6 mg/dL

## 2018-01-07 MED ORDER — LIDOCAINE HCL (PF) 1 % IJ SOLN
INTRAMUSCULAR | Status: DC | PRN
  Administered 2018-01-07: 3 mL

## 2018-01-07 MED ORDER — OXYTOCIN 40 UNITS IN LACTATED RINGERS INFUSION - SIMPLE MED
INTRAVENOUS | Status: AC
  Administered 2018-01-07: 1 m[IU]/min via INTRAVENOUS
  Filled 2018-01-07: qty 1000

## 2018-01-07 MED ORDER — PHENYLEPHRINE 40 MCG/ML (10ML) SYRINGE FOR IV PUSH (FOR BLOOD PRESSURE SUPPORT)
80.0000 ug | PREFILLED_SYRINGE | INTRAVENOUS | Status: DC | PRN
  Filled 2018-01-07: qty 10

## 2018-01-07 MED ORDER — DIPHENHYDRAMINE HCL 50 MG/ML IJ SOLN: 12.5000 mg | INTRAMUSCULAR | Status: DC | PRN

## 2018-01-07 MED ORDER — LIDOCAINE HCL (PF) 2 % IJ SOLN
INTRAMUSCULAR | Status: DC | PRN
  Administered 2018-01-07: 5 mL via INTRADERMAL

## 2018-01-07 MED ORDER — OXYTOCIN 40 UNITS IN LACTATED RINGERS INFUSION - SIMPLE MED
1.0000 m[IU]/min | INTRAVENOUS | Status: DC
  Administered 2018-01-07: 1 m[IU]/min via INTRAVENOUS

## 2018-01-07 MED ORDER — SOD CITRATE-CITRIC ACID 500-334 MG/5ML PO SOLN: 30.0000 mL | ORAL | Status: DC | PRN

## 2018-01-07 MED ORDER — LIDOCAINE HCL (PF) 1 % IJ SOLN
INTRAMUSCULAR | Status: AC
  Filled 2018-01-07: qty 30

## 2018-01-07 MED ORDER — MISOPROSTOL 25 MCG QUARTER TABLET: 25.0000 ug | ORAL_TABLET | ORAL | Status: DC | PRN

## 2018-01-07 MED ORDER — SODIUM CHLORIDE 0.9 % IV SOLN
2.0000 g | Freq: Four times a day (QID) | INTRAVENOUS | Status: DC
  Administered 2018-01-07 – 2018-01-09 (×6): 2 g via INTRAVENOUS
  Filled 2018-01-07 (×5): qty 2000
  Filled 2018-01-07 (×2): qty 2
  Filled 2018-01-07 (×2): qty 2000

## 2018-01-07 MED ORDER — OXYTOCIN 10 UNIT/ML IJ SOLN
INTRAMUSCULAR | Status: AC
  Filled 2018-01-07: qty 2

## 2018-01-07 MED ORDER — EPHEDRINE 5 MG/ML INJ
10.0000 mg | INTRAVENOUS | Status: DC | PRN
  Filled 2018-01-07: qty 2

## 2018-01-07 MED ORDER — LIDOCAINE-EPINEPHRINE (PF) 1.5 %-1:200000 IJ SOLN
INTRAMUSCULAR | Status: DC | PRN
  Administered 2018-01-07: 3 mL via PERINEURAL

## 2018-01-07 MED ORDER — MISOPROSTOL 200 MCG PO TABS
ORAL_TABLET | ORAL | Status: AC
  Administered 2018-01-08: 800 ug
  Filled 2018-01-07: qty 4

## 2018-01-07 MED ORDER — BUPIVACAINE HCL (PF) 0.25 % IJ SOLN
INTRAMUSCULAR | Status: DC | PRN
  Administered 2018-01-07: 10 mL via EPIDURAL

## 2018-01-07 MED ORDER — FENTANYL 2.5 MCG/ML W/ROPIVACAINE 0.15% IN NS 100 ML EPIDURAL (ARMC)
12.0000 mL/h | EPIDURAL | Status: DC
  Administered 2018-01-07: 12 mL/h via EPIDURAL

## 2018-01-07 MED ORDER — OXYTOCIN BOLUS FROM INFUSION: 500.0000 mL | Freq: Once | INTRAVENOUS | Status: DC

## 2018-01-07 MED ORDER — BUTORPHANOL TARTRATE 1 MG/ML IJ SOLN
1.0000 mg | INTRAMUSCULAR | Status: DC | PRN
  Administered 2018-01-07 (×2): 1 mg via INTRAVENOUS
  Filled 2018-01-07 (×2): qty 1

## 2018-01-07 MED ORDER — ONDANSETRON HCL 4 MG/2ML IJ SOLN
4.0000 mg | Freq: Four times a day (QID) | INTRAMUSCULAR | Status: DC | PRN
  Administered 2018-01-07: 4 mg via INTRAVENOUS
  Filled 2018-01-07: qty 2

## 2018-01-07 MED ORDER — LIDOCAINE HCL (PF) 1 % IJ SOLN: 30.0000 mL | INTRAMUSCULAR | Status: DC | PRN

## 2018-01-07 MED ORDER — LACTATED RINGERS IV SOLN: 500.0000 mL | INTRAVENOUS | Status: DC | PRN

## 2018-01-07 MED ORDER — LACTATED RINGERS IV SOLN
INTRAVENOUS | Status: DC
  Administered 2018-01-07 (×3): via INTRAVENOUS

## 2018-01-07 MED ORDER — GENTAMICIN SULFATE 40 MG/ML IJ SOLN
5.0000 mg/kg | Freq: Once | INTRAVENOUS | Status: AC
  Administered 2018-01-07: 390 mg via INTRAVENOUS
  Filled 2018-01-07: qty 9.75

## 2018-01-07 MED ORDER — OXYTOCIN 40 UNITS IN LACTATED RINGERS INFUSION - SIMPLE MED: 2.5000 [IU]/h | INTRAVENOUS | Status: DC

## 2018-01-07 MED ORDER — AMMONIA AROMATIC IN INHA
RESPIRATORY_TRACT | Status: AC
  Filled 2018-01-07: qty 10

## 2018-01-07 MED ORDER — ACETAMINOPHEN 325 MG PO TABS
650.0000 mg | ORAL_TABLET | ORAL | Status: DC | PRN
  Administered 2018-01-07: 650 mg via ORAL
  Filled 2018-01-07: qty 2

## 2018-01-07 MED ORDER — TERBUTALINE SULFATE 1 MG/ML IJ SOLN: 0.2500 mg | Freq: Once | INTRAMUSCULAR | Status: DC | PRN

## 2018-01-07 MED ORDER — FENTANYL 2.5 MCG/ML W/ROPIVACAINE 0.15% IN NS 100 ML EPIDURAL (ARMC)
EPIDURAL | Status: AC
  Filled 2018-01-07: qty 100

## 2018-01-07 MED ORDER — LACTATED RINGERS IV SOLN: 500.0000 mL | Freq: Once | INTRAVENOUS | Status: DC

## 2018-01-07 NOTE — Progress Notes (Signed)
S: pt c/o feeling pressure  O: BP (!) 106/56   Pulse (!) 132   Temp 100.2 F (37.9 C) (Axillary)   Resp 16   Ht 5\' 1"  (1.549 m)   Wt 77.2 kg   LMP  (Approximate)   SpO2 99%   BMI 32.14 kg/m   Temp improved, pulse 90-100s FHr tracing, 145bpm, + accels, no decels, mod variability UCs q2-693min  SVE C/C/+2  A: 2nd stage labor, GHTN at 39+2wks  P: begin pushing- anticipate SVD.  Randa NgoRebecca A Timberlyn Pickford, CNM 01/07/2018  11:26 PM

## 2018-01-07 NOTE — Anesthesia Procedure Notes (Signed)
Epidural Patient location during procedure: OB Start time: 01/07/2018 4:01 PM End time: 01/07/2018 4:09 PM  Staffing Anesthesiologist: Yves Dillarroll, Antonino Nienhuis, MD  Preanesthetic Checklist Completed: patient identified, site marked, surgical consent, pre-op evaluation, timeout performed, IV checked, risks and benefits discussed and monitors and equipment checked  Epidural Patient position: sitting Prep: Betadine Patient monitoring: heart rate, continuous pulse ox and blood pressure Approach: midline Location: L3-L4 Injection technique: LOR air  Needle:  Needle type: Tuohy  Needle gauge: 17 G Needle length: 9 cm and 9 Catheter type: closed end flexible Catheter size: 19 Gauge Test dose: negative and 1.5% lidocaine with Epi 1:200 K  Assessment Sensory level: T8 Events: blood not aspirated, injection not painful, no injection resistance, negative IV test and no paresthesia  Additional Notes Time out called.  Patient placed in sitting position.  The back was prepped in sterile fashion.  A skin wheal was made in the L3-L4 interspace with 1% Lidocaine plain.  A 17G Tuohy needle was guided into the epidural space by a loss of resistance technique.  The epidural catheter was threaded 3 cm into the epidural space and the TD was negative.  No blood or paresthesias.  The patient tolerated the procedure well and the catheter was affixed to the back in sterile fashion.Reason for block:procedure for pain

## 2018-01-07 NOTE — Anesthesia Preprocedure Evaluation (Signed)
Anesthesia Evaluation  Patient identified by MRN, date of birth, ID band Patient awake    Reviewed: Allergy & Precautions, NPO status , Patient's Chart, lab work & pertinent test results  Airway Mallampati: II  TM Distance: >3 FB Neck ROM: Full    Dental  (+) Dental Advisory Given   Pulmonary neg pulmonary ROS,    breath sounds clear to auscultation       Cardiovascular hypertension,  Rhythm:Regular Rate:Normal     Neuro/Psych negative neurological ROS  negative psych ROS   GI/Hepatic negative GI ROS, Neg liver ROS,   Endo/Other  diabetes  Renal/GU negative Renal ROS  negative genitourinary   Musculoskeletal negative musculoskeletal ROS (+)   Abdominal   Peds negative pediatric ROS (+)  Hematology negative hematology ROS (+)   Anesthesia Other Findings   Reproductive/Obstetrics (+) Pregnancy                             Anesthesia Physical  Anesthesia Plan  ASA: I  Anesthesia Plan: Spinal   Post-op Pain Management: GA combined w/ Regional for post-op pain   Induction:   PONV Risk Score and Plan:   Airway Management Planned: Natural Airway  Additional Equipment:   Intra-op Plan:   Post-operative Plan:   Informed Consent: I have reviewed the patients History and Physical, chart, labs and discussed the procedure including the risks, benefits and alternatives for the proposed anesthesia with the patient or authorized representative who has indicated his/her understanding and acceptance.   Dental advisory given  Plan Discussed with: CRNA  Anesthesia Plan Comments:         Anesthesia Quick Evaluation

## 2018-01-07 NOTE — Progress Notes (Signed)
Labor Progress Note  Stacy Madden is a 32 y.o. G1P0000 at [redacted]w[redacted]d by US at 2930w0d. Presented in early labor, and noted to have persistent elevated BPs.   Subjective: feeling pain with UCs, requesting repeat IVPM.   Objective: BP (!) 148/86   Pulse 87   Temp 97.7 F (36.5 C) (Oral)   Resp 16   Ht 5\' 1"  (1.549 m)   Wt 77.2 kg   LMP  (Approximate)   BMI 32.14 kg/m  Notable VS details: reviewed, mild range BPs persist.   Fetal Assessment: FHT:  FHR: 140 bpm, variability: moderate,  accelerations:  Present,  decelerations:  Absent Category/reactivity:  Category I UC:   regular, every 1.5-3 minutes; Pitocin at 4910mu/min SVE:   4/80/-1 with tight BBOW, SROM after SVE, small amt clear fluid.  Membrane status: SROM at 1500 Amniotic color: Clear  Labs: Lab Results  Component Value Date   WBC 10.6 (H) 01/07/2018   HGB 12.9 01/07/2018   HCT 37.3 01/07/2018   MCV 86.7 01/07/2018   PLT 220 01/07/2018  P/C ratio: too low to calculate CMP: WNL  Assessment / Plan: Gestational HTN at 39+2wks Augmentation of labor GDMA2- on metformin with good control.   Labor: Progressing slowly, on Pitocin with adeuate UC pattern, SROM after exam.  Preeclampsia:  labs stable and mild range BPs, no sx pre-e Fetal Wellbeing:  Category I Pain Control:  IV pain meds I/D:  GBS neg Anticipated MOD:  NSVD  Prudencio PairRebecca A McVey, CNM 01/07/2018, 3:04 PM

## 2018-01-07 NOTE — Progress Notes (Signed)
Labor Progress Note  Stacy Madden is a 32 y.o. G1P0000 at 2354w2d by US at 2260w0d. Presented in early labor, and noted to have persistent elevated BPs.   Subjective: comfortable with epidural  Objective: BP 116/73   Pulse (!) 114   Temp 99.4 F (37.4 C) (Oral)   Resp 16   Ht 5\' 1"  (1.549 m)   Wt 77.2 kg   LMP  (Approximate)   SpO2 99%   BMI 32.14 kg/m  Notable VS details: temp 101.9 axillary, normotensive.   Fetal Assessment: FHT:  FHR: 160 bpm, variability: moderate,  accelerations:  Present,  decelerations:  Absent Category/reactivity:  Category I UC:   regular, every 2-3 minutes; pitocin at 2610mu/min SVE:   8/C/-1, clear fluid, bloody show.  Membrane status: SROM, 6hrs Amniotic color: clear  Labs: Lab Results  Component Value Date   WBC 10.6 (H) 01/07/2018   HGB 12.9 01/07/2018   HCT 37.3 01/07/2018   MCV 86.7 01/07/2018   PLT 220 01/07/2018    Assessment / Plan: augmentation of labor at 39+2wks, GHTN  Maternal fever with maternal and fetal tachycardia  Labor: progressing well on pitocin.  Preeclampsia:  normotensive since epidural placed.  Fetal Wellbeing:  Category I Pain Control:  Epidural I/D:  Chorio dx withmaternal and fetal tachycardia with maternal fever. Rx Amp and Gent.  Anticipated MOD:  NSVD  Randa NgoRebecca A Nicola Heinemann, CNM 01/07/2018, 9:18 PM

## 2018-01-07 NOTE — H&P (Signed)
OB History & Physical   History of Present Illness:  Chief Complaint: vaginal spotting  HPI:  Stacy Madden is a 32 y.o. G1P0000 female at Unknown dated by US at 5921w0d.  She presents to L&D for vaginal bleeding yesterday and this am with irregular contractions overnight. Currently only having spotting. Reports active FM and denies SROM or LOF.  - Upon arrival, noted to have elevated BP in triage. Pt denies HA, RUQ pain or VD.   Pregnancy Issues: 1. GDMA2 on Metformin 2. Elevated BP on admission  3. Hx abnormal Pap 04/2016 and 11/2016   Maternal Medical History:   Past Medical History:  Diagnosis Date  . Medical history non-contributory     Past Surgical History:  Procedure Laterality Date  . ORIF WRIST FRACTURE Left 09/07/2015   Procedure: OPEN REDUCTION INTERNAL FIXATION (ORIF) WRIST FRACTURE AND CARPAL TUNNEL RELEASE;  Surgeon: Jodi GeraldsJohn Graves, MD;  Location: [facility] OR;  Service: Orthopedics;  Laterality: Left;    No Known Allergies  Prior to Admission medications   Medication Sig Start Date End Date Taking? Authorizing Provider  metFORMIN (GLUCOPHAGE) 500 MG tablet Take by mouth 2 (two) times daily with a meal.   Yes [provider]  Prenatal Vit-Fe Fumarate-FA (PRENATAL MULTIVITAMIN) TABS tablet Take 1 tablet by mouth daily at 12 noon.   Yes [provider]  cephALEXin (KEFLEX) 500 MG capsule Take 1 capsule (500 mg total) by mouth 3 (three) times daily. Patient not taking: Reported on 01/07/2018 01/05/15   Loleta RoseForbach, Cory, MD  ondansetron [facility](ZOFRAN) 4 MG tablet Take 1-2 tabs by mouth every 8 hours as needed for nausea/vomiting Patient not taking: Reported on 01/07/2018 01/05/15   Loleta RoseForbach, Cory, MD  oxyCODONE-acetaminophen (PERCOCET/ROXICET) 5-325 MG tablet Take 1-2 tablets by mouth every 6 (six) hours as needed for severe pain. Patient not taking: Reported on 01/07/2018 09/07/15   Marshia LyBethune, James, PA-C     [facility]  Social History: She  reports that she has never smoked. She has never used smokeless tobacco. She reports previous alcohol use. She reports that she does not use drugs.  Family History: no family hx gyn cancers  Review of Systems: A full review of systems was performed and negative except as noted in the HPI.     Physical Exam:  Vital Signs: BP 140/88 (BP Location: Left Arm)   Pulse 80   Temp 98.3 F (36.8 C) (Oral)   Resp 16   Ht 5\' 1"  (1.549 m)   Wt 77.2 kg   LMP  (Approximate)   BMI 32.14 kg/m   General: no acute distress.  HEENT: normocephalic, atraumatic Heart: regular rate & rhythm.  No murmurs/rubs/gallops Lungs: clear to auscultation bilaterally, normal respiratory effort Abdomen: soft, gravid, non-tender;  EFW: 7lbs Pelvic:   External: Normal external female genitalia  Cervix: Dilation: 2 / Effacement (%): 50, 60 / Station: -3    Extremities: non-tender, symmetric, no edema bilaterally.  DTRs: 2+  Neurologic: Alert & oriented x 3.    Results for orders placed or performed during the hospital encounter of 01/07/18 (from the past 24 hour(s))  CBC     Status: Abnormal   Collection Time: 01/07/18  6:50 AM  Result Value Ref Range   WBC 10.6 (H) 4.0 - 10.5 K/uL   RBC 4.30 3.87 - 5.11 MIL/uL   Hemoglobin 12.9 12.0 - 15.0 g/dL   HCT 16.137.3 09.636.0 - 04.546.0 %   MCV 86.7 80.0 - 100.0  fL   MCH 30.0 26.0 - 34.0 pg   MCHC 34.6 30.0 - 36.0 g/dL   RDW 16.1 09.6 - 04.5 %   Platelets 220 150 - 400 K/uL   nRBC 0.0 0.0 - 0.2 %  Comprehensive metabolic panel     Status: Abnormal   Collection Time: 01/07/18  6:50 AM  Result Value Ref Range   Sodium 136 135 - 145 mmol/L   Potassium 3.6 3.5 - 5.1 mmol/L   Chloride 109 98 - 111 mmol/L   CO2 18 (L) 22 - 32 mmol/L   Glucose, Bld 92 70 - 99 mg/dL   BUN 10 6 - 20 mg/dL   Creatinine, Ser 4.09 0.44 - 1.00 mg/dL   Calcium 9.2 8.9 - 81.1 mg/dL   Total Protein 6.6 6.5 - 8.1 g/dL   Albumin 3.3 (L) 3.5 - 5.0 g/dL   AST 28 15 - 41  U/L   ALT 26 0 - 44 U/L   Alkaline Phosphatase 221 (H) 38 - 126 U/L   Total Bilirubin 0.6 0.3 - 1.2 mg/dL   GFR calc non Af Amer >60 >60 mL/min   GFR calc Af Amer >60 >60 mL/min   Anion gap 9 5 - 15  Type and screen     Status: None (Preliminary result)   Collection Time: 01/07/18  6:51 AM  Result Value Ref Range   ABO/RH(D) PENDING    Antibody Screen PENDING    Sample Expiration      01/10/2018 Performed at [facility], [address] [place], [place] [address]   Glucose, capillary     Status: None   Collection Time: 01/07/18  6:59 AM  Result Value Ref Range   Glucose-Capillary 77 70 - 99 mg/dL    Pertinent Results:  Prenatal Labs: Blood type/Rh  O Pos  Antibody screen neg  Rubella Immune  Varicella Immune  RPR NR  HBsAg Neg  HIV NR  GC neg  Chlamydia neg  Genetic screening  no results, note in [facility] abnormal AFP  1 hour GTT  164  3 hour GTT  [phone]  GBS  neg   FHT: 135bpm, + accels, no decels, mod variability TOCO: 1-37min SVE:  Dilation: 2 / Effacement (%): 50, 60 / Station: -3    Cephalic by leopolds  US Ob Limited  Result Date: 12/28/2017 CLINICAL DATA:  Possible breech presentation. EXAM: LIMITED OBSTETRIC ULTRASOUND FINDINGS: Number of Fetuses: 1 Heart Rate:  143 bpm Movement: Yes Presentation: Cephalic Placental Location: Anterior Previa: No Amniotic Fluid (Subjective):  Within normal limits. AFI: 17.7 cm BPD: 9.03 cm 36 w  4 d MATERNAL FINDINGS: Cervix:  Cervix poorly visualized. Uterus/Adnexae: No abnormality visualized. IMPRESSION: 1. Single living intrauterine gestation with an estimated gestational age of [redacted] weeks and 4 days. 2. Cephalic presentation. This exam is performed on an emergent basis and does not comprehensively evaluate fetal size, dating, or anatomy; follow-up complete OB US should be considered if further fetal assessment is warranted. Electronically Signed   By: Signa Kell M.D.   On: 12/28/2017 14:09    Assessment:   Stacy Madden is a 32 y.o. G1P0000 female at 39+2wks with GHTN.   Plan:  1. Admit to Labor & Delivery; consents reviewed and obtained - GHTN dx on elevated BPs persistently mild range since arrival.  - P/C ratio pending - CMP/CBC WNL  2. Fetal Well being  - Fetal Tracing: Cat I tracing - Group B Streptococcus ppx indicated: negative - Presentation:  cephalic confirmed by exam and leopolds   3. Routine OB: - Prenatal labs reviewed, as above - Rh O Pos - CBC, T&S, RPR on admit - Clear fluids, IVF  4. Induction of Labor d/t GHTN -  Contractions: external toco in place -  Pelvis unproven, adequate for TOL -  Plan for induction, with pitocin -  Plan for continuous fetal monitoring  -  Maternal pain control as desired; discussed IVPM, nitrous, regional anesthesia - Anticipate vaginal delivery  5. Post Partum Planning: - Infant feeding: breast and bottle - Contraception: undecided  Randa NgoRebecca A Donica Derouin, CNM 01/07/18 7:37 AM

## 2018-01-07 NOTE — OB Triage Note (Signed)
Pt is a G1 P0 at 39w 2d w/ c/o ctx and vaginal bleeding. Vaginal bleeding began Friday morning at 8am with spots when wiping and ctx tonight at 1am rating 3/10. Pt states positive fetal movement. Pt states possible gush of fluid yesterday 12 noon, but has stated no leaking since. Monitors applied and assessing. Initial FHT 140

## 2018-01-08 LAB — CBC
HCT: 31.7 % — ABNORMAL LOW (ref 36.0–46.0)
Hemoglobin: 10.9 g/dL — ABNORMAL LOW (ref 12.0–15.0)
MCH: 29.9 pg (ref 26.0–34.0)
MCHC: 34.4 g/dL (ref 30.0–36.0)
MCV: 87.1 fL (ref 80.0–100.0)
NRBC: 0 % (ref 0.0–0.2)
Platelets: 173 10*3/uL (ref 150–400)
RBC: 3.64 MIL/uL — AB (ref 3.87–5.11)
RDW: 13.5 % (ref 11.5–15.5)
WBC: 19 10*3/uL — AB (ref 4.0–10.5)

## 2018-01-08 MED ORDER — WITCH HAZEL-GLYCERIN EX PADS: 1.0000 "application " | MEDICATED_PAD | CUTANEOUS | Status: DC | PRN

## 2018-01-08 MED ORDER — OXYTOCIN 40 UNITS IN LACTATED RINGERS INFUSION - SIMPLE MED
INTRAVENOUS | Status: AC
  Filled 2018-01-08: qty 1000

## 2018-01-08 MED ORDER — LOPERAMIDE HCL 2 MG PO CAPS
2.0000 mg | ORAL_CAPSULE | Freq: Four times a day (QID) | ORAL | Status: DC | PRN
  Filled 2018-01-08: qty 1

## 2018-01-08 MED ORDER — IBUPROFEN 600 MG PO TABS
600.0000 mg | ORAL_TABLET | Freq: Four times a day (QID) | ORAL | Status: DC
  Administered 2018-01-08 – 2018-01-10 (×9): 600 mg via ORAL
  Filled 2018-01-08 (×8): qty 1

## 2018-01-08 MED ORDER — MISOPROSTOL 200 MCG PO TABS: 800.0000 ug | ORAL_TABLET | Freq: Once | ORAL | Status: DC

## 2018-01-08 MED ORDER — IBUPROFEN 800 MG PO TABS
ORAL_TABLET | ORAL | Status: AC
  Filled 2018-01-08: qty 1

## 2018-01-08 MED ORDER — DIPHENHYDRAMINE HCL 25 MG PO CAPS: 25.0000 mg | ORAL_CAPSULE | Freq: Four times a day (QID) | ORAL | Status: DC | PRN

## 2018-01-08 MED ORDER — DIBUCAINE 1 % RE OINT: 1.0000 "application " | TOPICAL_OINTMENT | RECTAL | Status: DC | PRN

## 2018-01-08 MED ORDER — SENNOSIDES-DOCUSATE SODIUM 8.6-50 MG PO TABS
2.0000 | ORAL_TABLET | ORAL | Status: DC
  Administered 2018-01-09: 2 via ORAL
  Filled 2018-01-08: qty 2

## 2018-01-08 MED ORDER — CARBOPROST TROMETHAMINE 250 MCG/ML IM SOLN
INTRAMUSCULAR | Status: AC
  Administered 2018-01-08: 250 ug via INTRAMUSCULAR
  Filled 2018-01-08: qty 1

## 2018-01-08 MED ORDER — COCONUT OIL OIL: 1.0000 "application " | TOPICAL_OIL | Status: DC | PRN

## 2018-01-08 MED ORDER — CARBOPROST TROMETHAMINE 250 MCG/ML IM SOLN
250.0000 ug | Freq: Once | INTRAMUSCULAR | Status: DC
  Administered 2018-01-08: 250 ug via INTRAMUSCULAR

## 2018-01-08 MED ORDER — FERROUS SULFATE 325 (65 FE) MG PO TABS
325.0000 mg | ORAL_TABLET | Freq: Two times a day (BID) | ORAL | Status: DC
  Administered 2018-01-08 – 2018-01-09 (×4): 325 mg via ORAL
  Filled 2018-01-08 (×4): qty 1

## 2018-01-08 MED ORDER — ONDANSETRON HCL 4 MG PO TABS: 4.0000 mg | ORAL_TABLET | ORAL | Status: DC | PRN

## 2018-01-08 MED ORDER — SIMETHICONE 80 MG PO CHEW: 80.0000 mg | CHEWABLE_TABLET | ORAL | Status: DC | PRN

## 2018-01-08 MED ORDER — BENZOCAINE-MENTHOL 20-0.5 % EX AERO: 1.0000 "application " | INHALATION_SPRAY | CUTANEOUS | Status: DC | PRN

## 2018-01-08 MED ORDER — ACETAMINOPHEN 325 MG PO TABS: 650.0000 mg | ORAL_TABLET | ORAL | Status: DC | PRN

## 2018-01-08 MED ORDER — ZOLPIDEM TARTRATE 5 MG PO TABS: 5.0000 mg | ORAL_TABLET | Freq: Every evening | ORAL | Status: DC | PRN

## 2018-01-08 MED ORDER — ONDANSETRON HCL 4 MG/2ML IJ SOLN: 4.0000 mg | INTRAMUSCULAR | Status: DC | PRN

## 2018-01-08 MED ORDER — PRENATAL MULTIVITAMIN CH
1.0000 | ORAL_TABLET | Freq: Every day | ORAL | Status: DC
  Administered 2018-01-08 – 2018-01-09 (×2): 1 via ORAL
  Filled 2018-01-08 (×3): qty 1

## 2018-01-08 NOTE — Plan of Care (Signed)
Voices understanding of instructions

## 2018-01-08 NOTE — Discharge Summary (Signed)
Obstetrical Discharge Summary  [facility] DOB: 25-Mar-1985 MRN: [id number]  Date of Admission: 01/07/2018 Date of Delivery: 01/08/18 Delivered by: Bonnell Public McVey CNM Date of Discharge: 01/10/2018  [facility]  LMP: 03/19/17 EDC Estimated Date of Delivery: 01/12/18 Gestational Age at Delivery: 1110w3d   Antepartum complications:  1. GDMA2 on Metformin 2. Elevated BP on admission  3. Hx abnormal Pap 04/2016 and 11/2016  Admitting Diagnosis: GHTN Secondary Diagnosis: SVD, 1st deg, left periurethral lac  Patient Active Problem List   Diagnosis Date Noted  . Gestational diabetes mellitus (GDM) controlled on oral hypoglycemic drug, antepartum 01/07/2018  . Displaced fracture of distal end of left radius 09/07/2015    Augmentation: Pitocin Complications: None Intrapartum complications/course:  Noted to have elevated BP with normal labs in triage, early labor. Dx GHTN, augment with pitocin, SVD on 01/08/18.  Date of Delivery: 01/08/18 Delivered By: Bonnell Public McVey CNM Delivery Type: spontaneous vaginal delivery Anesthesia: epidural Placenta: spontaneous Laceration: 1st deg perineal lac, left periurethral laceration with repair Episiotomy: none Newborn Data: Live born female "Stacy Madden" Birth Weight:  7#13.9 APGAR: 8, 9  Newborn Delivery   Birth date/time:  01/08/2018 00:35:00 Delivery type:  Vaginal, Spontaneous       Postpartum Procedures: none  Post partum course:  Patient had an uncomplicated postpartum course.  By time of discharge on PPD#2, her pain was controlled on oral pain medications; she had appropriate lochia and was ambulating, voiding without difficulty and tolerating regular diet.  She was deemed stable for discharge to home.       Discharge Physical Exam:  BP 129/86 (BP Location: Left Arm)   Pulse 83   Temp 98.5 F (36.9 C) (Oral)   Resp 20   Ht 5\' 1"  (1.549 m)   Wt 77.2 kg   LMP  (Approximate)   SpO2 100%   BMI 32.14  kg/m   General: NAD CV: RRR Pulm: CTABL, nl effort ABD: s/nd/nt, fundus firm and below the umbilicus Lochia: moderate DVT Evaluation: LE non-ttp, no evidence of DVT on exam.  Hemoglobin  Date Value Ref Range Status  01/09/2018 9.6 (L) 12.0 - 15.0 g/dL Final   HCT  Date Value Ref Range Status  01/09/2018 27.7 (L) 36.0 - 46.0 % Final     Disposition: stable, discharge to home. Baby Feeding: breastmilk Baby Disposition: home with mom  Rh Immune globulin given: n/a Rubella vaccine given: n/a Varicella vaccine given: n/a Tdap vaccine given in AP setting: 10/26/17 Flu vaccine given in AP setting: 10/26/17  Contraception: TBD  Prenatal Labs:  Blood type/Rh  O Pos  Antibody screen neg  Rubella Immune  Varicella Immune  RPR NR  HBsAg Neg  HIV NR  GC neg  Chlamydia neg  Genetic screening  no results, [facility]  1 hour GTT  164  3 hour GTT  [phone]  GBS  neg      Plan:  [facility] in good condition. Follow-up appointment with delivering provider in 6 weeks.  Discharge Medications: Allergies as of 01/10/2018   No Known Allergies     Medication List    STOP taking these medications   cephALEXin 500 MG capsule Commonly known as:  KEFLEX   metFORMIN 500 MG tablet Commonly known as:  GLUCOPHAGE   ondansetron 4 MG tablet Commonly known as:  ZOFRAN   oxyCODONE-acetaminophen 5-325 MG tablet Commonly known as:  PERCOCET/ROXICET     TAKE these medications  prenatal multivitamin Tabs tablet Take 1 tablet by mouth daily at 12 noon.       Follow-up Information    McVey, Prudencio PairRebecca A, CNM Follow up in 6 week(s).   Specialty:  Obstetrics and Gynecology Contact information: [address] [place] [address] [phone]           Signed:  ----- Ranae Plumberhelsea Luke Falero, MD Attending Obstetrician and Gynecologist [facility], Department of OB/GYN [facility]

## 2018-01-09 LAB — CBC
HCT: 27.7 % — ABNORMAL LOW (ref 36.0–46.0)
Hemoglobin: 9.6 g/dL — ABNORMAL LOW (ref 12.0–15.0)
MCH: 30.5 pg (ref 26.0–34.0)
MCHC: 34.7 g/dL (ref 30.0–36.0)
MCV: 87.9 fL (ref 80.0–100.0)
NRBC: 0 % (ref 0.0–0.2)
Platelets: 159 10*3/uL (ref 150–400)
RBC: 3.15 MIL/uL — ABNORMAL LOW (ref 3.87–5.11)
RDW: 13.9 % (ref 11.5–15.5)
WBC: 11.8 10*3/uL — AB (ref 4.0–10.5)

## 2018-01-09 LAB — RPR: RPR: NONREACTIVE

## 2018-01-09 MED ORDER — SODIUM CHLORIDE 0.9 % IV SOLN: INTRAVENOUS | Status: DC | PRN

## 2018-01-09 NOTE — Progress Notes (Signed)
Dr. Elesa MassedWard notified patient has been afebrile greater than 24 hours, WBC trending down. Verbal order to discontinue IV Ampicillin for chorio treatment.

## 2018-01-09 NOTE — Anesthesia Postprocedure Evaluation (Signed)
Anesthesia Post Note  [facility]  Procedure(s) Performed: AN AD HOC LABOR EPIDURAL  Patient location during evaluation: Mother Baby Anesthesia Type: Epidural Level of consciousness: awake and alert Pain management: pain level controlled Vital Signs Assessment: post-procedure vital signs reviewed and stable Respiratory status: spontaneous breathing, nonlabored ventilation and respiratory function stable Cardiovascular status: stable Postop Assessment: no headache, no backache and epidural receding Anesthetic complications: no     Last Vitals:  Vitals:   01/08/18 1950 01/09/18 0030  BP: 97/77 115/73  Pulse: 93 91  Resp: 20 20  Temp: 36.7 C 36.6 C  SpO2: 98%     Last Pain:  Vitals:   01/09/18 0030  TempSrc: Oral  PainSc:                  Rica MastBachich,  Neta Upadhyay M

## 2018-01-09 NOTE — Progress Notes (Signed)
Post Partum Day 1  Subjective: Doing well, no complaints.  Tolerating regular diet, pain with PO meds, voiding and ambulating without difficulty.  No CP SOB F/C N/V or leg pain no HA change of vision, RUQ/epigastric pain  Objective: BP 129/90 (BP Location: Left Arm)   Pulse 82   Temp 98.5 F (36.9 C) (Oral)   Resp 18   Ht 5\' 1"  (1.549 m)   Wt 77.2 kg   LMP  (Approximate)   SpO2 97%   BMI 32.14 kg/m    Physical Exam:  General: NAD CV: RRR Pulm: nl effort, CTABL Lochia: moderate Uterine Fundus: fundus firm and below umbilicus DVT Evaluation: no cords, ttp LEs   Recent Labs    01/08/18 0545 01/09/18 0838  HGB 10.9* 9.6*  HCT 31.7* 27.7*  WBC 19.0* 11.8*  PLT 173 159    Assessment/Plan: 32 y.o. G1P0000 postpartum day # 1  1. Continue routine postpartum cares 2. Lactation support 3. Improvement of leukocytosis secondary to chorio.  D/C antibiotics. 4. Continue inpatient management.  Anticipated discharge tomorrow.      ----- Ranae Plumberhelsea Jacqualyn Sedgwick, MD Attending Obstetrician and Gynecologist [facility]

## 2018-01-09 NOTE — Lactation Note (Signed)
This note was copied from a baby's chart. Lactation Consultation Note  Patient Name: Stacy Raquel JamesMercy Dorko ZOXWR'UToday's Madden: 01/09/2018 Reason for consult: Follow-up assessment;Primapara;Term;Other (Comment)([name] has tight frenulum & mom has hard areola & flat nipples) Mom gave 20 ml formula via bottle through the night because she perceived that she did not have enough milk d/t [name] cluster feeding and being fussy after breastfeeding when putting her back in the crib.  Explained risks of continuing to give bottles of formula to milk supply and success of breastfeeding.  Assisted mom with breast feeding at 09:55 am because [name] was not receptive to going to the breast after getting so many bottles.  She would not even latch to the breast without nipple shield.  Once we got her to latch, she would take a couple of weak sucks and then come off the breast sleeping.  Then had to put formula in tip of nipple shield to get her to sustain the latch and keep sucking.  Required frequent breast massage and stimulation along with a few drops of formula along side of nipple shield  to keep her actively sucking at the breast.  After several attempts, she finally started rhythmic sucking and swallowing.  Mom kept letting her slip to tip of nipple shield.  Demonstrated keeping her close with nose and chin touching breast.  Lactation name and number written on white board and encouraged to call for questions, concerns or assistance trying to avoid giving any more bottles.  Mom called out for this feeding because she could not find her nipple shield.  Tried without nipple shield and was able to get her latched and sucking at the breast.        Maternal Data Formula Feeding for Exclusion: No Has patient been taught Hand Expression?: Yes Does the patient have breastfeeding experience prior to this delivery?: No  Feeding Feeding Type: Breast Fed  LATCH Score Latch: Repeated attempts needed to sustain latch, nipple held in  mouth throughout feeding, stimulation needed to elicit sucking reflex.  Audible Swallowing: A few with stimulation  Type of Nipple: Flat  Comfort (Breast/Nipple): Soft / non-tender  Hold (Positioning): Assistance needed to correctly position infant at breast and maintain latch.  LATCH Score: 6  Interventions Interventions: Assisted with latch;Reverse pressure;Breast compression;Adjust position;Support pillows  Lactation Tools Discussed/Used WIC Program: Yes([employer] [facility]   Consult Status Consult Status: PRN Follow-up type: Call as needed    Louis MeckelWilliams, Yong Wahlquist Kay 01/09/2018, 5:53 PM

## 2018-01-10 MED ORDER — IBUPROFEN 600 MG PO TABS: 600.0000 mg | ORAL_TABLET | Freq: Four times a day (QID) | ORAL | Status: DC

## 2018-01-10 NOTE — Discharge Instructions (Signed)
Parto vaginal, cuidados posteriores °Vaginal Delivery, Care After °Siga estas instrucciones durante las próximas semanas. Estas indicaciones le proporcionan información acerca de cómo deberá cuidarse después del parto vaginal. Su médico también podrá darle indicaciones más específicas. El tratamiento ha sido planificado según las prácticas médicas actuales, pero en algunos casos pueden ocurrir problemas. Llame al médico si tiene problemas o preguntas. °¿Qué puedo esperar después del procedimiento? °Después de un parto vaginal, es frecuente tener lo siguiente: °· Hemorragia leve de la vagina. °· Dolor en el abdomen, la vagina y la zona de la piel entre la abertura vaginal y el ano (perineo). °· Calambres pélvicos. °· Fatiga. °Siga estas indicaciones en su casa: °Medicamentos °· Tome los medicamentos de venta libre y los recetados solamente como se lo haya indicado el médico. °· Si le recetaron un antibiótico, tómelo como se lo haya indicado el médico. No interrumpa la administración del antibiótico hasta que lo haya terminado. °Conducir ° °· No conduzca ni opere maquinaria pesada mientras toma analgésicos recetados. °· No conduzca durante 24 horas si le administraron un sedante. °Estilo de vida °· No beba alcohol. Esto es de suma importancia si está amamantando o toma analgésicos. °· No consuma productos que contengan tabaco, incluidos cigarrillos, tabaco de mascar o cigarrillos electrónicos. Si necesita ayuda para dejar de fumar, consulte al médico. °Qué debe comer y beber °· Beba al menos 8 vasos de ocho onzas (240 cc) de agua todos los días a menos que el médico le indique lo contrario. Si elige amamantar al bebé, quizá deba beber aún más cantidad de agua. °· Coma alimentos ricos en fibras todos los días. Estos alimentos pueden ayudarla a prevenir o aliviar el estreñimiento. Los alimentos ricos en fibras incluyen, entre otros: °? Panes y cereales integrales. °? Arroz integral. °? Frijoles. °? Frutas y verduras  frescas. °Actividad °· Retome sus actividades normales como se lo haya indicado el médico. Pregúntele al médico qué actividades son seguras para usted. °· Descanse todo lo que pueda. Trate de descansar o tomar una siesta mientras el bebé está durmiendo. °· No levante objetos que pesen más que su bebé o 10 libras (4,5 kg) hasta que el médico le diga que es seguro. °· Hable con el médico sobre cuándo puede retomar la actividad sexual. Esto puede depender de lo siguiente: °? Riesgo de sufrir una infección. °? Velocidad de cicatrización. °? Comodidad y deseo de retomar la actividad sexual. °Cuidados vaginales °· Si le realizaron una episiotomía o tuvo un desgarro vaginal, contrólese la zona todos los días para detectar signos de infección. Esté atenta a los siguientes signos: °? Aumento del enrojecimiento, la hinchazón o el dolor. °? Mayor presencia de líquido o sangre. °? Calor. °? Pus o mal olor. °· No use tampones ni se haga duchas vaginales hasta que el médico la autorice. °· Controle la sangre que elimina por la vagina para detectar coágulos de sangre. Estos pueden tener el aspecto de grumos de color rojo oscuro, o secreción marrón o negra. °Instrucciones generales °· Mantenga el perineo limpio y seco, como se lo haya indicado el médico. °· Use ropa cómoda y suelta. °· Cuando vaya al baño, siempre higienícese de adelante hacia atrás. °· Pregúntele al médico si puede ducharse o tomar baños de inmersión. Si se le realizó una episiotomía o tuvo un desgarro perineal durante el trabajo del parto o el parto, es posible que el médico le indique que no tome baños de inmersión durante un determinado tiempo. °· Use un sostén que sujete y ajuste bien sus pechos. °· Si   es posible, pídale a alguien que la ayude con las tareas del hogar y a cuidar del bebé durante al menos algunos días después de que le den el alta del hospital. °· Concurra a todas las visitas de seguimiento para usted y el bebé, como se lo haya indicado el  médico. Esto es importante. °Comuníquese con un médico si: °· Tiene los siguientes síntomas: °? Secreción vaginal que tiene mal olor. °? Dificultad para orinar. °? Dolor al orinar. °? Aumento o disminución repentinos de la frecuencia de las deposiciones. °? Más enrojecimiento, hinchazón o dolor alrededor de la episiotomía o del desgarro vaginal. °? Más secreción de líquido o sangre de la episiotomía o del desgarro vaginal. °? Pus o mal olor proveniente de la episiotomía o del desgarro vaginal. °? Fiebre. °? Erupción cutánea. °? Poco interés o falta de interés en actividades que solían gustarle. °? Dudas sobre su cuidado y el del bebé. °· Siente la episiotomía o el desgarro vaginal caliente al tacto. °· La episiotomía o el desgarro vaginal se abren o no parecen cicatrizar. °· Siente dolor en las mamas, o están duras o enrojecidas. °· Siente tristeza o preocupación de forma inusual. °· Siente náuseas o vomita. °· Elimina coágulos de sangre grandes por la vagina. Si expulsa un coágulo de sangre por la vagina, guárdelo para mostrárselo a su médico. No tire la cadena sin que el médico examine el coágulo de sangre antes. °· Orina más de lo habitual. °· Se siente mareada o se desmaya. °· No ha amamantado para nada y no ha tenido un período menstrual durante 12 semanas después del parto. °· Dejó de amamantar al bebé y no ha tenido su período menstrual durante 12 semanas después de dejar de amamantar. °Solicite ayuda de inmediato si: °· Tiene los siguientes síntomas: °? Dolor que no desaparece o no mejora con medicamentos. °? Dolor en el pecho. °? Dificultad para respirar. °? Visión borrosa o manchas en la vista. °? Pensamientos de autolesionarse o lesionar al bebé. °· Comienza a sentir dolor en el abdomen o en una de las piernas. °· Presenta un dolor de cabeza intenso. °· Se desmaya. °· Tiene una hemorragia de la vagina tan intensa que empapa dos toallitas sanitarias en una hora. °Esta información no tiene como fin  reemplazar el consejo del médico. Asegúrese de hacerle al médico cualquier pregunta que tenga. °Document Released: 01/04/2005 Document Revised: 04/28/2016 Document Reviewed: 01/19/2015 °Elsevier Interactive Patient Education © 2019 [employer] Inc[employer] ° °

## 2018-01-10 NOTE — Progress Notes (Signed)
Patient discharged home with infant. Discharge instructions given and reviewed with patient. Patient verbalized understanding. Will be escorted out by staff. 

## 2018-01-10 NOTE — Plan of Care (Signed)
Vs stable; up ad lib; tolerating regular diet; taking motrin for pain control; potential discharge today 

## 2018-09-28 ENCOUNTER — Emergency Department
Admission: EM | Admit: 2018-09-28 | Discharge: 2018-09-28 | Disposition: A | Discharge status: Home / Self Care | Payer: No Typology Code available for payment source | Attending: Emergency Medicine | Admitting: Emergency Medicine

## 2018-09-28 ENCOUNTER — Emergency Department: Payer: No Typology Code available for payment source

## 2018-09-28 ENCOUNTER — Encounter: Payer: Self-pay | Admitting: Emergency Medicine

## 2018-09-28 ENCOUNTER — Other Ambulatory Visit: Payer: Self-pay

## 2018-09-28 DIAGNOSIS — Y9389 Activity, other specified: Secondary | ICD-10-CM | POA: Insufficient documentation

## 2018-09-28 DIAGNOSIS — R52 Pain, unspecified: Secondary | ICD-10-CM

## 2018-09-28 DIAGNOSIS — Y999 Unspecified external cause status: Secondary | ICD-10-CM | POA: Diagnosis not present

## 2018-09-28 DIAGNOSIS — M795 Residual foreign body in soft tissue: Secondary | ICD-10-CM

## 2018-09-28 DIAGNOSIS — W458XXA Other foreign body or object entering through skin, initial encounter: Secondary | ICD-10-CM | POA: Diagnosis not present

## 2018-09-28 DIAGNOSIS — S61205A Unspecified open wound of left ring finger without damage to nail, initial encounter: Secondary | ICD-10-CM | POA: Insufficient documentation

## 2018-09-28 DIAGNOSIS — Y929 Unspecified place or not applicable: Secondary | ICD-10-CM | POA: Insufficient documentation

## 2018-09-28 DIAGNOSIS — X58XXXA Exposure to other specified factors, initial encounter: Secondary | ICD-10-CM | POA: Diagnosis not present

## 2018-09-28 MED ORDER — LIDOCAINE HCL 1 % IJ SOLN
10.0000 mL | Freq: Once | INTRAMUSCULAR | Status: AC
  Administered 2018-09-28: 10 mL

## 2018-09-28 MED ORDER — LIDOCAINE HCL (PF) 1 % IJ SOLN
INTRAMUSCULAR | Status: AC
  Filled 2018-09-28: qty 5

## 2018-09-28 MED ORDER — SULFAMETHOXAZOLE-TRIMETHOPRIM 800-160 MG PO TABS: 1.0000 | ORAL_TABLET | Freq: Two times a day (BID) | ORAL | 0 refills | Status: AC

## 2018-09-28 NOTE — ED Provider Notes (Signed)
[facility] Emergency Department Provider Note  ____________________________________________  Time seen: Approximately 6:29 PM  I have reviewed the triage vital signs and the nursing notes.   HISTORY  Chief Complaint No chief complaint on file.    HPI Stacy Madden is a 33 y.o. female presents to the emergency department with a sewing hook foreign body that penetrated the dorsal aspect of the left third digit.  Patient sustained foreign body at her place of work.  No numbness or tingling in the left hand.  Patient reports that her tetanus status is updated.  No other alleviating measures have been attempted.        Past Medical History:  Diagnosis Date  . Medical history non-contributory     Patient Active Problem List   Diagnosis Date Noted  . Gestational diabetes mellitus (GDM) controlled on oral hypoglycemic drug, antepartum 01/07/2018  . Displaced fracture of distal end of left radius 09/07/2015    Past Surgical History:  Procedure Laterality Date  . ORIF WRIST FRACTURE Left 09/07/2015   Procedure: OPEN REDUCTION INTERNAL FIXATION (ORIF) WRIST FRACTURE AND CARPAL TUNNEL RELEASE;  Surgeon: Dorna Leitz, MD;  Location: [place];  Service: Orthopedics;  Laterality: Left;    Prior to Admission medications   Medication Sig Start Date End Date Taking? Authorizing Provider  Prenatal Vit-Fe Fumarate-FA (PRENATAL MULTIVITAMIN) TABS tablet Take 1 tablet by mouth daily at 12 noon.    [provider]  sulfamethoxazole-trimethoprim (BACTRIM DS) 800-160 MG tablet Take 1 tablet by mouth 2 (two) times daily for 7 days. 09/28/18 10/05/18  Lannie Fields, PA-C    Allergies Patient has no known allergies.  No family history on file.  Social History Social History   Tobacco Use  . Smoking status: Never Smoker  . Smokeless tobacco: Never Used  Substance Use Topics  . Alcohol use: Not Currently  . Drug use: No     Review of Systems   Constitutional: No fever/chills Eyes: No visual changes. No discharge ENT: No upper respiratory complaints. Cardiovascular: no chest pain. Respiratory: no cough. No SOB. Gastrointestinal: No abdominal pain.  No nausea, no vomiting.  No diarrhea.  No constipation. Musculoskeletal: Patient has foreign body of left second digit. Skin: Negative for rash, abrasions, lacerations, ecchymosis. Neurological: Negative for headaches, focal weakness or numbness.   ____________________________________________   PHYSICAL EXAM:  VITAL SIGNS: ED Triage Vitals  Enc Vitals Group     BP 09/28/18 1640 126/77     Pulse Rate 09/28/18 1640 84     Resp 09/28/18 1640 18     Temp 09/28/18 1640 98.7 F (37.1 C)     Temp Source 09/28/18 1640 Oral     SpO2 09/28/18 1640 96 %     Weight 09/28/18 1640 165 lb (74.8 kg)     Height 09/28/18 1640 5\' 4"  (1.626 m)     Head Circumference --      Peak Flow --      Pain Score 09/28/18 1642 3     Pain Loc --      Pain Edu? --      Excl. in [place]? --      Constitutional: Alert and oriented. Well appearing and in no acute distress. Eyes: Conjunctivae are normal. PERRL. EOMI. Head: Atraumatic. Cardiovascular: Normal rate, regular rhythm. Normal S1 and S2.  Good peripheral circulation. Respiratory: Normal respiratory effort without tachypnea or retractions. Lungs CTAB. Good air entry to the bases with no decreased or absent breath sounds.  Musculoskeletal: Foreign body has penetrated the dorsal aspect of the left third digit along the ulnar aspect of the digit.  Foreign body appears lateral to flexor and extensor tendons.  Palpable radial pulse bilaterally and symmetrically.  Capillary refill of left third digit is less than 2 seconds. Neurologic:  Normal speech and language. No gross focal neurologic deficits are appreciated.  Skin:  Skin is warm, dry and intact. No rash noted. Psychiatric: Mood and affect are normal. Speech and behavior are normal. Patient exhibits  appropriate insight and judgement.   ____________________________________________   LABS (all labs ordered are listed, but only abnormal results are displayed)  Labs Reviewed - No data to display ____________________________________________  EKG   ____________________________________________  RADIOLOGY I personally viewed and evaluated these images as part of my medical decision making, as well as reviewing the written report by the radiologist.  Dg Hand Complete Left  Result Date: 09/28/2018 CLINICAL DATA:  Needle stuck in third digit EXAM: LEFT HAND - COMPLETE 3+ VIEW COMPARISON:  None. FINDINGS: No obvious fracture or dislocation of the left hand. There is a needle which penetrates the dorsal aspect of the left third finger and very closely abuts the medial aspect of the distal interphalangeal joint; it is not clear whether this pierces the distal aspect of the middle phalanx or not given views provided, however the tip is within soft tissue. No other radiopaque foreign body appreciated. Joint spaces are well preserved. Chronic fracture deformity of the left fifth metacarpal. Plate and screw fixation of the distal left radius. Chronic, calluse fracture fragment of the ulnar styloid. IMPRESSION: No obvious fracture or dislocation of the left hand. There is a needle which penetrates the dorsal aspect of the left third finger and very closely abuts the medial aspect of the distal interphalangeal joint; it is not clear whether this pierces the distal aspect of the middle phalanx or not given views provided, however the tip is within soft tissue. No other radiopaque foreign body appreciated. Electronically Signed   By: Lauralyn Primes M.D.   On: 09/28/2018 17:46    ____________________________________________    PROCEDURES  Procedure(s) performed:    Procedures Foreign Body Removal Performed by: Orvil Feil Authorized by: Orvil Feil Consent: Verbal consent obtained. Risks and  benefits: risks, benefits and alternatives were discussed Consent given by: patient Patient identity confirmed: provided demographic data Prepped and Draped in normal sterile fashion Wound explored  Foreign body location: Left middle finger   Anesthesia: local infiltration and digital block  Local anesthetic: lidocaine 1% without epinephrine  Anesthetic total: 10 ml  Irrigation method: syringe Amount of cleaning: standard  After digit was anesthetized, a small incision was made along the volar aspect of the digit and foreign body was pushed through to the other side.  Ring cutter was used to remove hook and foreign body was then removed using traction.  Patient tolerance: Patient tolerated the procedure well with no immediate complications.    Medications  lidocaine (XYLOCAINE) 1 % (with pres) injection 10 mL (has no administration in time range)  lidocaine (PF) (XYLOCAINE) 1 % injection (has no administration in time range)     ____________________________________________   INITIAL IMPRESSION / ASSESSMENT AND PLAN / ED COURSE  Pertinent labs & imaging results that were available during my care of the patient were reviewed by me and considered in my medical decision making (see chart for details).  Review of the [place] CSRS was performed in accordance of the [facility] prior  to dispensing any controlled drugs.           Assessment and plan Foreign body removal 33 year old female presents to the emergency department for foreign body removal.  Foreign body was removed without complication.  X-ray examination of the left hand revealed no bony abnormality.  Tetanus status is up-to-date.  Patient was discharged with Bactrim.  She was advised to follow-up with Dr. Stephenie AcresSoria as needed.  All patient questions were answered.     ____________________________________________  FINAL CLINICAL IMPRESSION(S) / ED DIAGNOSES  Final diagnoses:  Soft tissues foreign body      NEW  MEDICATIONS STARTED DURING THIS VISIT:  ED Discharge Orders         Ordered    sulfamethoxazole-trimethoprim (BACTRIM DS) 800-160 MG tablet  2 times daily     09/28/18 1820              This chart was dictated using voice recognition software/Dragon. Despite best efforts to proofread, errors can occur which can change the meaning. Any change was purely unintentional.    Orvil FeilWoods, Jaclyn M, PA-C 09/28/18 1837    Concha SeFunke, Mary E, MD 09/28/18 2024

## 2018-09-28 NOTE — ED Notes (Signed)
Patient has a slender tool sticking into  Left 3rd finger. No drainage or numbness noted. Patient states finger is numb.

## 2018-09-28 NOTE — ED Triage Notes (Signed)
Pt has needle stuck in RT third digit from work. Pt works in [employer]. Pt is workers Tax adviser. NAD noted. PT able to move digit approp

## 2018-09-28 NOTE — Discharge Instructions (Signed)
Take Bactrim twice daily for 1 week. Keep wound clean and dry for the next 24 hours. Return to the emergency department with redness or streaking surrounding digit. Please follow-up with Dr. Peggye Ley as needed.

## 2018-10-17 ENCOUNTER — Telehealth: Payer: Self-pay | Admitting: Women's Health

## 2018-10-17 NOTE — Telephone Encounter (Signed)
Called patient regarding appointment and the following message was left: ° ° °We have you scheduled for an upcoming appointment at our office. At this time, patients are encouraged to come alone to their visits whenever possible, however, a support person, over age 31, may accompany you to your appointment if assistance is needed for safety or care concerns. Otherwise, support persons should remain outside until the visit is complete.  ° °We ask if you have had any exposure to anyone suspected or confirmed of having COVID-19 or if you are experiencing any of the following, to call and reschedule your appointment: fever, cough, shortness of breath, muscle pain, diarrhea, rash, vomiting, abdominal pain, red eye, weakness, bruising, bleeding, joint pain, or a severe headache.  ° °Please know we will ask you these questions or similar questions when you arrive for your appointment and again it’s how we are keeping everyone safe.   ° °Also,to keep you safe, please use the provided hand sanitizer when you enter the office. We are asking everyone in the office to wear a mask to help prevent the spread of °germs. If you have a mask of your own, please wear it to your appointment, if not, we are happy to provide one for you. ° °Thank you for understanding and your cooperation.  ° ° °[facility] Staff ° ° ° ° ° °

## 2018-10-18 ENCOUNTER — Encounter: Payer: PRIVATE HEALTH INSURANCE | Admitting: Women's Health

## 2020-04-04 ENCOUNTER — Other Ambulatory Visit: Payer: Self-pay

## 2020-04-04 ENCOUNTER — Emergency Department: Payer: No Typology Code available for payment source

## 2020-04-04 ENCOUNTER — Emergency Department
Admission: EM | Admit: 2020-04-04 | Discharge: 2020-04-04 | Disposition: A | Discharge status: Home / Self Care | Payer: No Typology Code available for payment source | Attending: Emergency Medicine | Admitting: Emergency Medicine

## 2020-04-04 ENCOUNTER — Encounter: Payer: Self-pay | Admitting: Emergency Medicine

## 2020-04-04 DIAGNOSIS — W458XXA Other foreign body or object entering through skin, initial encounter: Secondary | ICD-10-CM | POA: Diagnosis not present

## 2020-04-04 DIAGNOSIS — S60453A Superficial foreign body of left middle finger, initial encounter: Secondary | ICD-10-CM | POA: Insufficient documentation

## 2020-04-04 DIAGNOSIS — Y990 Civilian activity done for income or pay: Secondary | ICD-10-CM | POA: Insufficient documentation

## 2020-04-04 DIAGNOSIS — S60459A Superficial foreign body of unspecified finger, initial encounter: Secondary | ICD-10-CM

## 2020-04-04 DIAGNOSIS — S6992XA Unspecified injury of left wrist, hand and finger(s), initial encounter: Secondary | ICD-10-CM | POA: Diagnosis present

## 2020-04-04 MED ORDER — NAPROXEN 375 MG PO TABS: 375.0000 mg | ORAL_TABLET | Freq: Two times a day (BID) | ORAL | 0 refills | Status: DC

## 2020-04-04 MED ORDER — LIDOCAINE HCL (PF) 1 % IJ SOLN
5.0000 mL | Freq: Once | INTRAMUSCULAR | Status: AC
  Administered 2020-04-04: 5 mL
  Filled 2020-04-04: qty 5

## 2020-04-04 NOTE — ED Provider Notes (Signed)
.  Foreign Body Removal  Date/Time: 04/04/2020 2:14 PM Performed by: Sharman Cheek, MD Authorized by: Sharman Cheek, MD  Consent: Verbal consent obtained. Risks and benefits: risks, benefits and alternatives were discussed Patient understanding: patient states understanding of the procedure being performed Patient consent: the patient's understanding of the procedure matches consent given Imaging studies: imaging studies available Patient identity confirmed: verbally with patient and arm band Body area: skin General location: upper extremity Location details: left long finger Anesthesia: digital block  Anesthesia: Local Anesthetic: lidocaine 1% without epinephrine Anesthetic total: 3 mL  Sedation: Patient sedated: no  Patient restrained: no Patient cooperative: yes Localization method: ultrasound Removal mechanism: scalpel and forceps Dressing: dressing applied Tendon involvement: none Depth: subcutaneous Complexity: complex 1 objects recovered. Objects recovered: metal tip of knitting hook Post-procedure assessment: foreign body removed Patient tolerance: patient tolerated the procedure well with no immediate complications   Medical screening examination/treatment/procedure(s) were conducted as a shared visit with non-physician practitioner(s) and myself.  I personally evaluated the patient during the encounter.  Removal of embedded metal hook required localization of object with ultrasound and making 1cm incision through skin at site to expose the object.  Blunt dissection of subQ tissue with clamp then allowed for grasping of hook and retrieval.  Distal neuro and tendon function intact after removal, no arterial bleeding.          Sharman Cheek, MD 04/04/20 [phone]

## 2020-04-04 NOTE — Discharge Instructions (Addendum)
Follow discharge care instructions and have sutures removed in 10 days.

## 2020-04-04 NOTE — ED Triage Notes (Signed)
Patient ambulatory to triage with steady gait, without difficulty or distress noted; pt arrives from work with knitting hook embedded in left index finger (employed with [employer] comp profile indicates UDS and blood alcohol required); Stacy Madden, supervisor [phone] accomp pt and due to lack of pt's picture identity, supervisor verifies pt identity

## 2020-04-04 NOTE — ED Provider Notes (Signed)
[facility] Emergency Department Provider Note   ____________________________________________   Event Date/Time   First MD Initiated Contact with Patient 04/04/20 [phone]     (approximate)  I have reviewed the triage vital signs and the nursing notes.   HISTORY  Chief Complaint Foreign Body    HPI Antonella Sharnese Heath is a 35 y.o. female patient presents with foreign body to the distal phalanx of the third digit left hand.  This is a work-related injury consisting of a knitting hook embedded in the left index finger.  Patient denies loss sensation loss of function.  Patient is right-hand dominant.  Tetanus shot not up-to-date.  Patient rates pain as a 5/10.  Patient state pain only occurs with movement of the affected digit.  Patient described the pain as "sore".  No palliative measure prior to arrival.         Past Medical History:  Diagnosis Date  . Medical history non-contributory     Patient Active Problem List   Diagnosis Date Noted  . Gestational diabetes mellitus (GDM) controlled on oral hypoglycemic drug, antepartum 01/07/2018  . Displaced fracture of distal end of left radius 09/07/2015    Past Surgical History:  Procedure Laterality Date  . ORIF WRIST FRACTURE Left 09/07/2015   Procedure: OPEN REDUCTION INTERNAL FIXATION (ORIF) WRIST FRACTURE AND CARPAL TUNNEL RELEASE;  Surgeon: Jodi Geralds, MD;  Location: [facility] OR;  Service: Orthopedics;  Laterality: Left;    Prior to Admission medications   Medication Sig Start Date End Date Taking? Authorizing Provider  naproxen (NAPROSYN) 375 MG tablet Take 1 tablet (375 mg total) by mouth 2 (two) times daily with a meal. 04/04/20  Yes Joni Reining, PA-C  Prenatal Vit-Fe Fumarate-FA (PRENATAL MULTIVITAMIN) TABS tablet Take 1 tablet by mouth daily at 12 noon.    [provider]    Allergies Patient has no known allergies.  No family history on file.  Social History Social History    Tobacco Use  . Smoking status: Never Smoker  . Smokeless tobacco: Never Used  Substance Use Topics  . Alcohol use: Not Currently  . Drug use: No    Review of Systems Constitutional: No fever/chills Eyes: No visual changes. ENT: No sore throat. Cardiovascular: Denies chest pain. Respiratory: Denies shortness of breath. Gastrointestinal: No abdominal pain.  No nausea, no vomiting.  No diarrhea.  No constipation. Genitourinary: Negative for dysuria. Musculoskeletal: Negative for back pain. Skin: Negative for rash.  Foreign body left index finger. Neurological: Negative for headaches, focal weakness or numbness.   ____________________________________________   PHYSICAL EXAM:  VITAL SIGNS: ED Triage Vitals  Enc Vitals Group     BP 04/04/20 0701 (!) 130/92     Pulse Rate 04/04/20 0701 72     Resp 04/04/20 0701 16     Temp 04/04/20 0701 98.6 F (37 C)     Temp Source 04/04/20 0701 Oral     SpO2 04/04/20 0701 98 %     Weight 04/04/20 0658 170 lb (77.1 kg)     Height 04/04/20 0658 5\' 4"  (1.626 m)     Head Circumference --      Peak Flow --      Pain Score 04/04/20 0658 0     Pain Loc --      Pain Edu? --      Excl. in GC? --     Constitutional: Alert and oriented. Well appearing and in no acute distress. Cardiovascular: Normal rate, regular rhythm.  Grossly normal heart sounds.  Good peripheral circulation. Respiratory: Normal respiratory effort.  No retractions. Lungs CTAB. Musculoskeletal: No obvious deformity to the left index finger. Neurologic:  Normal speech and language. No gross focal neurologic deficits are appreciated. No gait instability. Skin:  Skin is warm, dry and intact. No rash noted.  Foreign body distal phalanx third digit left hand. Psychiatric: Mood and affect are normal. Speech and behavior are normal.  ____________________________________________   LABS (all labs ordered are listed, but only abnormal results are displayed)  Labs Reviewed - No  data to display ____________________________________________  EKG   ____________________________________________  RADIOLOGY I, Joni Reining, personally viewed and evaluated these images (plain radiographs) as part of my medical decision making, as well as reviewing the written report by the radiologist.  ED MD interpretation: Foreign body left index finger  Official radiology report(s): No results found.  ____________________________________________   PROCEDURES  Procedure(s) performed (including Critical Care):  Procedures   ____________________________________________   INITIAL IMPRESSION / ASSESSMENT AND PLAN / ED COURSE  As part of my medical decision making, I reviewed the following data within the electronic MEDICAL RECORD NUMBER         Foreign body left index finger.  See procedure note from Dr. Scotty Court for removal.      ____________________________________________   FINAL CLINICAL IMPRESSION(S) / ED DIAGNOSES  Final diagnoses:  Finger, superficial foreign body (splinter), initial encounter     ED Discharge Orders         Ordered    naproxen (NAPROSYN) 375 MG tablet  2 times daily with meals        04/04/20 1028          *Please note:  Kamarii Buren Kwiecinski was evaluated in Emergency Department on 04/05/2020 for the symptoms described in the history of present illness. She was evaluated in the context of the global COVID-19 pandemic, which necessitated consideration that the patient might be at risk for infection with the SARS-CoV-2 virus that causes COVID-19. Institutional protocols and algorithms that pertain to the evaluation of patients at risk for COVID-19 are in a state of rapid change based on information released by regulatory bodies including the CDC and federal and state organizations. These policies and algorithms were followed during the patient's care in the ED.  Some ED evaluations and interventions may be delayed as a result of limited  staffing during and the pandemic.*   Note:  This document was prepared using Dragon voice recognition software and may include unintentional dictation errors.    Joni Reining, PA-C 04/05/20 0630    Sharman Cheek, MD 04/05/20 1556

## 2020-04-04 NOTE — ED Notes (Signed)
Dr Stafford in with pt.  

## 2020-04-04 NOTE — ED Notes (Signed)
See triage note  Presents with hook in left index finger  States this is a knitting hook from work

## 2020-04-05 NOTE — ED Provider Notes (Signed)
[facility] Emergency Department Provider Note   ____________________________________________   Event Date/Time   First MD Initiated Contact with Patient 04/04/20 [phone]     (approximate)  I have reviewed the triage vital signs and the nursing notes.   HISTORY  Chief Complaint Foreign Body  Pt seen with PA Durward Parcel. I personally evaluated pt and performed entirety of exam and medical decision making myself.  HPI Stacy Madden is a 35 y.o. female patient presents with foreign body to the distal phalanx of the third digit left hand.  This is a work-related injury consisting of a knitting hook embedded in the left index finger.  Patient denies loss sensation loss of function.  Patient is right-hand dominant.  Tetanus shot not up-to-date.  Patient rates pain as a 5/10.  Patient state pain only occurs with movement of the affected digit.  Patient described the pain as "sore".  No palliative measure prior to arrival.         Past Medical History:  Diagnosis Date  . Medical history non-contributory     Patient Active Problem List   Diagnosis Date Noted  . Gestational diabetes mellitus (GDM) controlled on oral hypoglycemic drug, antepartum 01/07/2018  . Displaced fracture of distal end of left radius 09/07/2015    Past Surgical History:  Procedure Laterality Date  . ORIF WRIST FRACTURE Left 09/07/2015   Procedure: OPEN REDUCTION INTERNAL FIXATION (ORIF) WRIST FRACTURE AND CARPAL TUNNEL RELEASE;  Surgeon: Jodi Geralds, MD;  Location: [facility] OR;  Service: Orthopedics;  Laterality: Left;    Prior to Admission medications   Medication Sig Start Date End Date Taking? Authorizing Provider  naproxen (NAPROSYN) 375 MG tablet Take 1 tablet (375 mg total) by mouth 2 (two) times daily with a meal. 04/04/20  Yes Joni Reining, PA-C  Prenatal Vit-Fe Fumarate-FA (PRENATAL MULTIVITAMIN) TABS tablet Take 1 tablet by mouth daily at 12 noon.    [provider]     Allergies Patient has no known allergies.  No family history on file.  Social History Social History   Tobacco Use  . Smoking status: Never Smoker  . Smokeless tobacco: Never Used  Substance Use Topics  . Alcohol use: Not Currently  . Drug use: No    Review of Systems Constitutional: No fever/chills Eyes: No visual changes. ENT: No sore throat. Cardiovascular: Denies chest pain. Respiratory: Denies shortness of breath. Gastrointestinal: No abdominal pain.  No nausea, no vomiting.  No diarrhea.  No constipation. Genitourinary: Negative for dysuria. Musculoskeletal: Negative for back pain. Skin: Negative for rash.  Foreign body left index finger. Neurological: Negative for headaches, focal weakness or numbness.   ____________________________________________   PHYSICAL EXAM:  VITAL SIGNS: ED Triage Vitals  Enc Vitals Group     BP 04/04/20 0701 (!) 130/92     Pulse Rate 04/04/20 0701 72     Resp 04/04/20 0701 16     Temp 04/04/20 0701 98.6 F (37 C)     Temp Source 04/04/20 0701 Oral     SpO2 04/04/20 0701 98 %     Weight 04/04/20 0658 170 lb (77.1 kg)     Height 04/04/20 0658 5\' 4"  (1.626 m)     Head Circumference --      Peak Flow --      Pain Score 04/04/20 0658 0     Pain Loc --      Pain Edu? --      Excl. in GC? --  Constitutional: Alert and oriented. Well appearing and in no acute distress. Cardiovascular: Normal rate, regular rhythm. Grossly normal heart sounds.  Good peripheral circulation. Respiratory: Normal respiratory effort.  No retractions. Lungs CTAB. Musculoskeletal: No obvious deformity to the left index finger. Neurologic:  Normal speech and language. No gross focal neurologic deficits are appreciated. No gait instability. Skin:  Skin is warm, dry and intact. No rash noted.  Foreign body distal phalanx third digit left hand. Psychiatric: Mood and affect are normal. Speech and behavior are  normal.  ____________________________________________   LABS (all labs ordered are listed, but only abnormal results are displayed)  Labs Reviewed - No data to display ____________________________________________  EKG   ____________________________________________  RADIOLOGY Rebecka Apley, personally viewed and evaluated these images (plain radiographs) as part of my medical decision making, as well as reviewing the written report by the radiologist.  ED MD interpretation:  Retained hook shaped metallic FB adjacent to DIP of left second finger.  Official radiology report(s): DG Finger Index Left  Result Date: 04/04/2020 CLINICAL DATA:  Foreign body removal EXAM: LEFT INDEX FINGER 2+V COMPARISON:  Same day finger x-ray FINDINGS: There is a 4 mm linear metallic density within the distal second digit at the level of the distal interphalangeal joint. This is the hook of the knitting needle. No fracture IMPRESSION: 4 mm linear foreign body remains in the soft tissue adjacent to the DIP. Electronically Signed   By: Genevive Bi M.D.   On: 04/04/2020 09:21   DG Finger Index Left  Result Date: 04/04/2020 CLINICAL DATA:  Foreign body left index finger, knitting hook second finger, initial encounter. EXAM: LEFT INDEX FINGER 2+V COMPARISON:  None. FINDINGS: A linear metallic foreign body is seen within the soft tissues lateral to the second distal interphalangeal joint. No osseous penetration. Approximately 1.4 cm of the needle appears to be within the soft tissues. IMPRESSION: Approximately 1.4 cm portion of a needle within the soft tissues lateral to the second distal interphalangeal joint. No osseous penetration. Electronically Signed   By: Leanna Battles M.D.   On: 04/04/2020 08:55    ____________________________________________   PROCEDURES  Procedure(s) performed (including Critical Care):  Procedures See separately entered procedure note for foreign body  removal.  ____________________________________________   INITIAL IMPRESSION / ASSESSMENT AND PLAN / ED COURSE         Patient seen with PA Durward Parcel for foreign body of left index finger.  After initial attempt at removal, there was retained hook tip in the subcu tissue of the left index finger.  I remove this with ultrasound localization making a small incision immediately superficial to the hook and was able to retrieve with a Kelly clamp after applying digital block.  Wounds were hemostatic and closed with one 4-0 Vicryl simple interrupted suture..      ____________________________________________   FINAL CLINICAL IMPRESSION(S) / ED DIAGNOSES  Final diagnoses:  Finger, superficial foreign body (splinter), initial encounter     ED Discharge Orders         Ordered    naproxen (NAPROSYN) 375 MG tablet  2 times daily with meals        04/04/20 1028          *Please note:  Stacy Madden was evaluated in Emergency Department on 04/05/2020 for the symptoms described in the history of present illness. She was evaluated in the context of the global COVID-19 pandemic, which necessitated consideration that the patient might be at risk for infection  with the SARS-CoV-2 virus that causes COVID-19. Institutional protocols and algorithms that pertain to the evaluation of patients at risk for COVID-19 are in a state of rapid change based on information released by regulatory bodies including the CDC and federal and state organizations. These policies and algorithms were followed during the patient's care in the ED.  Some ED evaluations and interventions may be delayed as a result of limited staffing during and the pandemic.*   Note:  This document was prepared using Dragon voice recognition software and may include unintentional dictation errors.    Sharman Cheek, MD 04/05/20 [phone]

## 2020-07-08 ENCOUNTER — Emergency Department
Admission: EM | Admit: 2020-07-08 | Discharge: 2020-07-08 | Disposition: A | Discharge status: Home / Self Care | Payer: No Typology Code available for payment source | Attending: Student in an Organized Health Care Education/Training Program | Admitting: Student in an Organized Health Care Education/Training Program

## 2020-07-08 ENCOUNTER — Emergency Department: Payer: No Typology Code available for payment source

## 2020-07-08 ENCOUNTER — Other Ambulatory Visit: Payer: Self-pay

## 2020-07-08 ENCOUNTER — Encounter: Payer: Self-pay | Admitting: Emergency Medicine

## 2020-07-08 DIAGNOSIS — S0101XA Laceration without foreign body of scalp, initial encounter: Secondary | ICD-10-CM | POA: Diagnosis not present

## 2020-07-08 DIAGNOSIS — W228XXA Striking against or struck by other objects, initial encounter: Secondary | ICD-10-CM | POA: Diagnosis not present

## 2020-07-08 DIAGNOSIS — Y990 Civilian activity done for income or pay: Secondary | ICD-10-CM | POA: Insufficient documentation

## 2020-07-08 DIAGNOSIS — Z23 Encounter for immunization: Secondary | ICD-10-CM | POA: Insufficient documentation

## 2020-07-08 DIAGNOSIS — S0990XA Unspecified injury of head, initial encounter: Secondary | ICD-10-CM | POA: Insufficient documentation

## 2020-07-08 MED ORDER — TETANUS-DIPHTH-ACELL PERTUSSIS 5-2.5-18.5 LF-MCG/0.5 IM SUSY
0.5000 mL | PREFILLED_SYRINGE | Freq: Once | INTRAMUSCULAR | Status: AC
  Administered 2020-07-08: 0.5 mL via INTRAMUSCULAR
  Filled 2020-07-08: qty 0.5

## 2020-07-08 MED ORDER — IBUPROFEN 100 MG/5ML PO SUSP
600.0000 mg | Freq: Once | ORAL | Status: DC
  Filled 2020-07-08: qty 30

## 2020-07-08 MED ORDER — IBUPROFEN 600 MG PO TABS
600.0000 mg | ORAL_TABLET | Freq: Once | ORAL | Status: AC
  Administered 2020-07-08: 600 mg via ORAL
  Filled 2020-07-08: qty 1

## 2020-07-08 MED ORDER — LIDOCAINE-EPINEPHRINE-TETRACAINE (LET) TOPICAL GEL
3.0000 mL | Freq: Once | TOPICAL | Status: AC
  Administered 2020-07-08: 3 mL via TOPICAL
  Filled 2020-07-08: qty 3

## 2020-07-08 NOTE — ED Provider Notes (Signed)
[facility] Emergency Department Provider Note ____________________________________________   Event Date/Time   First MD Initiated Contact with Patient 07/08/20 1112     (approximate)  I have reviewed the triage vital signs and the nursing notes.   HISTORY  Chief Complaint Head Injury (Workers Comp)  HPI Stacy Madden is a 35 y.o. female with history as listed below presents to the emergency department for treatment and evaluation after being struck in the head by a signal roller that is held by a heavy spring. She denies loss of consciousness. Unsure of last Tdap. Head wound with bleeding controlled.          Past Medical History:  Diagnosis Date   Medical history non-contributory     Patient Active Problem List   Diagnosis Date Noted   Gestational diabetes mellitus (GDM) controlled on oral hypoglycemic drug, antepartum 01/07/2018   Displaced fracture of distal end of left radius 09/07/2015    Past Surgical History:  Procedure Laterality Date   ORIF WRIST FRACTURE Left 09/07/2015   Procedure: OPEN REDUCTION INTERNAL FIXATION (ORIF) WRIST FRACTURE AND CARPAL TUNNEL RELEASE;  Surgeon: Jodi Geralds, MD;  Location: [facility] OR;  Service: Orthopedics;  Laterality: Left;    Prior to Admission medications   Medication Sig Start Date End Date Taking? Authorizing Provider  naproxen (NAPROSYN) 375 MG tablet Take 1 tablet (375 mg total) by mouth 2 (two) times daily with a meal. 04/04/20   Joni Reining, PA-C  Prenatal Vit-Fe Fumarate-FA (PRENATAL MULTIVITAMIN) TABS tablet Take 1 tablet by mouth daily at 12 noon.    [provider]    Allergies Patient has no known allergies.  No family history on file.  Social History Social History   Tobacco Use   Smoking status: Never   Smokeless tobacco: Never  Substance Use Topics   Alcohol use: Not Currently   Drug use: No    Review of Systems  Constitutional: No fever/chills Eyes: No  visual changes. ENT: No sore throat. Cardiovascular: Denies chest pain. Respiratory: Denies shortness of breath. Gastrointestinal: No abdominal pain.  No nausea, no vomiting.  No diarrhea.  No constipation. Genitourinary: Negative for dysuria. Musculoskeletal: Negative for back pain. Skin: Positive for laceration Neurological: Positive for headaches, negative for focal weakness or numbness. Negative for LOC. ____________________________________________   PHYSICAL EXAM:  VITAL SIGNS: ED Triage Vitals  Enc Vitals Group     BP 07/08/20 1049 (!) 133/92     Pulse Rate 07/08/20 1049 79     Resp 07/08/20 1049 16     Temp 07/08/20 1049 98.9 F (37.2 C)     Temp Source 07/08/20 1049 Oral     SpO2 07/08/20 1049 98 %     Weight 07/08/20 1050 170 lb (77.1 kg)     Height 07/08/20 1050 5\' 4"  (1.626 m)     Head Circumference --      Peak Flow --      Pain Score 07/08/20 1049 6     Pain Loc --      Pain Edu? --      Excl. in GC? --     Constitutional: Alert and oriented. Well appearing and in no acute distress. Eyes: Conjunctivae are normal. PERRL. EOMI. Head: Scalp wound. Nose: No congestion/rhinnorhea. Mouth/Throat: Mucous membranes are moist.   Neck: No stridor.   Hematological/Lymphatic/Immunilogical: No cervical lymphadenopathy. Cardiovascular: Normal rate, regular rhythm. Grossly normal heart sounds.  Good peripheral circulation. Respiratory: Normal respiratory effort.  No retractions.  Gastrointestinal: Soft and nontender. No distention. No abdominal bruits. No CVA tenderness. Genitourinary:  Musculoskeletal: No lower extremity tenderness nor edema.  No joint effusions. Neurologic:  Normal speech and language. No gross focal neurologic deficits are appreciated. No gait instability. Skin:  3cm frontal scalp laceration Psychiatric: Mood and affect are normal. Speech and behavior are normal.  ____________________________________________   LABS (all labs ordered are listed,  but only abnormal results are displayed)  Labs Reviewed - No data to display ____________________________________________  EKG  Not indicated. ____________________________________________  RADIOLOGY  ED MD interpretation:    CT head negative for acute concerns with the exception of scalp laceration.  I, Kem Boroughs, personally viewed and evaluated these images (plain radiographs) as part of my medical decision making, as well as reviewing the written report by the radiologist.  Official radiology report(s): CT Head Wo Contrast  Result Date: 07/08/2020 CLINICAL DATA:  Head injury EXAM: CT HEAD WITHOUT CONTRAST TECHNIQUE: Contiguous axial images were obtained from the base of the skull through the vertex without intravenous contrast. COMPARISON:  None. FINDINGS: Brain: No evidence of acute infarction, hemorrhage, hydrocephalus, extra-axial collection or mass lesion/mass effect. Vascular: Negative for hyperdense vessel Skull: Negative for skull fracture Sinuses/Orbits: Mild mucosal edema paranasal sinuses. Negative orbit Other: Laceration and soft tissue swelling left frontal scalp. IMPRESSION: No acute intracranial abnormality.  Left frontal scalp laceration. Electronically Signed   By: Marlan Palau M.D.   On: 07/08/2020 12:41    ____________________________________________   PROCEDURES  Procedure(s) performed (including Critical Care):  Marland KitchenMarland KitchenLaceration Repair  Date/Time: 07/08/2020 2:58 PM Performed by: Chinita Pester, FNP Authorized by: Chinita Pester, FNP   Consent:    Consent obtained:  Verbal   Consent given by:  Patient   Risks discussed:  Pain and poor cosmetic result Anesthesia:    Anesthesia method:  Topical application Laceration details:    Location:  Scalp   Scalp location:  Frontal   Length (cm):  3 Pre-procedure details:    Preparation:  Imaging obtained to evaluate for foreign bodies Exploration:    Contaminated: no   Treatment:    Area cleansed  with:  Chlorhexidine and saline   Amount of cleaning:  Standard   Irrigation method:  Syringe Skin repair:    Repair method:  Staples   Number of staples:  2 Approximation:    Approximation:  Close Repair type:    Repair type:  Simple Post-procedure details:    Dressing:  Open (no dressing)   Procedure completion:  Tolerated well, no immediate complications  ____________________________________________   INITIAL IMPRESSION / ASSESSMENT AND PLAN     35 year old female presenting to the emergency department from work after sustaining a minor head injury and scalp laceration.  See HPI for further details.  Plan will be to update tetanus, get a CT image of her head, and repair the scalp laceration.   ED COURSE  Scalp laceration repaired as described above.  CT head is reassuring.  Wound care instructions discussed with the patient.  She is to follow-up with her primary care, go to urgent care, or return to the emergency department in 7 days for removal.    ___________________________________________   FINAL CLINICAL IMPRESSION(S) / ED DIAGNOSES  Final diagnoses:  Minor head injury, initial encounter  Laceration of scalp, initial encounter     ED Discharge Orders     None        Stacy Madden was evaluated in Emergency Department on 07/08/2020 for the  symptoms described in the history of present illness. She was evaluated in the context of the global COVID-19 pandemic, which necessitated consideration that the patient might be at risk for infection with the SARS-CoV-2 virus that causes COVID-19. Institutional protocols and algorithms that pertain to the evaluation of patients at risk for COVID-19 are in a state of rapid change based on information released by regulatory bodies including the CDC and federal and state organizations. These policies and algorithms were followed during the patient's care in the ED.   Note:  This document was prepared using Dragon voice  recognition software and may include unintentional dictation errors.    Chinita Pester, FNP 07/08/20 1459    Willy Eddy, MD 07/08/20 1756

## 2020-07-08 NOTE — ED Triage Notes (Signed)
Pt to ED via POV, pt states that she was hit in the head with a signal roller. Pt has dressing in place, bleeding is controlled at this time

## 2020-07-08 NOTE — Discharge Instructions (Addendum)
Do not get the sutured area wet for 24 hours. After 24 hours, shower/bathe as usual and pat the area dry. See your PCP or go to Urgent Care in 7 days for staple removal or sooner for signs or concern of infection.

## 2021-01-18 NOTE — L&D Delivery Note (Signed)
Delivery Note  First Stage: Labor onset: 1730 Induction: cytotec, Cook cath, pitocin, AROM  Analgesia /Anesthesia intrapartum: epidural AROM 8/6 at 1903  Second Stage: Complete dilation at 0314 Onset of pushing at 0315; pushed poorly until 0455; rested until 0530 and resumed pushing with good effort.  FHR second stage Cat II- variable decels  Delivery of a viable female infant on 08/24/21 at 0705 by CNM delivery of fetal head in LOA position with restitution to LOT. Tight nuchal cord x 2;  Anterior then posterior shoulders delivered easily with gentle downward traction; infant delivered via somersault maneuver. Baby placed on mom's chest, and attended to by peds.  Cord double clamped after cessation of pulsation, cut by FOB Cord blood sample collected  Arterial cord blood sample sent  Third Stage: Placenta delivered spontaneously intact with [facility] @ 0709 Placenta disposition: To pathology, noted marginal velamentous insertion of 2 vessels for about 6inches through the membranes. Uterine tone Firm / bleeding small; Cytotec PR given prophylactically due to prior PPH.   No lacerations identified   Est. Blood Loss (mL): 150  Complications: Chorio dx in 2nd stage, given Unasyn 3gm IVPB x 1 dose.   Mom to postpartum.  Baby to  SCN .  Newborn: Birth Weight: 7#4 Apgar Scores: 2/5/8 Feeding planned: breast and formula

## 2021-03-03 LAB — OB RESULTS CONSOLE HIV ANTIBODY (ROUTINE TESTING): HIV: NONREACTIVE

## 2021-03-03 LAB — OB RESULTS CONSOLE RUBELLA ANTIBODY, IGM: Rubella: NON-IMMUNE/NOT IMMUNE

## 2021-03-03 LAB — OB RESULTS CONSOLE VARICELLA ZOSTER ANTIBODY, IGG: Varicella: IMMUNE

## 2021-03-03 LAB — OB RESULTS CONSOLE HEPATITIS B SURFACE ANTIGEN: Hepatitis B Surface Ag: NEGATIVE

## 2021-08-03 ENCOUNTER — Other Ambulatory Visit: Payer: Self-pay | Admitting: Family Medicine

## 2021-08-03 DIAGNOSIS — Z3689 Encounter for other specified antenatal screening: Secondary | ICD-10-CM

## 2021-08-03 LAB — OB RESULTS CONSOLE GBS: GBS: NEGATIVE

## 2021-08-03 LAB — OB RESULTS CONSOLE RPR: RPR: NONREACTIVE

## 2021-08-18 ENCOUNTER — Encounter: Payer: Self-pay | Admitting: *Deleted

## 2021-08-19 ENCOUNTER — Ambulatory Visit: Payer: BC Managed Care – PPO | Admitting: *Deleted

## 2021-08-19 ENCOUNTER — Ambulatory Visit: Payer: BC Managed Care – PPO | Attending: Family Medicine

## 2021-08-19 VITALS — BP 125/83 | HR 85

## 2021-08-19 DIAGNOSIS — Z3689 Encounter for other specified antenatal screening: Secondary | ICD-10-CM | POA: Insufficient documentation

## 2021-08-19 DIAGNOSIS — O24415 Gestational diabetes mellitus in pregnancy, controlled by oral hypoglycemic drugs: Secondary | ICD-10-CM | POA: Insufficient documentation

## 2021-08-23 ENCOUNTER — Encounter: Payer: Self-pay | Admitting: Obstetrics and Gynecology

## 2021-08-23 ENCOUNTER — Other Ambulatory Visit: Payer: Self-pay

## 2021-08-23 ENCOUNTER — Inpatient Hospital Stay: Payer: BC Managed Care – PPO | Admitting: Anesthesiology

## 2021-08-23 ENCOUNTER — Inpatient Hospital Stay
Admission: EM | Admit: 2021-08-23 | Discharge: 2021-08-25 | DRG: 805 | Disposition: A | Discharge status: Home / Self Care | Payer: BC Managed Care – PPO | Attending: Obstetrics | Admitting: Obstetrics

## 2021-08-23 DIAGNOSIS — O4443 Low lying placenta NOS or without hemorrhage, third trimester: Secondary | ICD-10-CM | POA: Diagnosis present

## 2021-08-23 DIAGNOSIS — O43123 Velamentous insertion of umbilical cord, third trimester: Secondary | ICD-10-CM | POA: Diagnosis present

## 2021-08-23 DIAGNOSIS — O24425 Gestational diabetes mellitus in childbirth, controlled by oral hypoglycemic drugs: Secondary | ICD-10-CM | POA: Diagnosis present

## 2021-08-23 DIAGNOSIS — O411230 Chorioamnionitis, third trimester, not applicable or unspecified: Secondary | ICD-10-CM | POA: Diagnosis present

## 2021-08-23 DIAGNOSIS — Z3A39 39 weeks gestation of pregnancy: Secondary | ICD-10-CM | POA: Diagnosis not present

## 2021-08-23 DIAGNOSIS — Z23 Encounter for immunization: Secondary | ICD-10-CM | POA: Diagnosis not present

## 2021-08-23 DIAGNOSIS — O99214 Obesity complicating childbirth: Secondary | ICD-10-CM | POA: Diagnosis present

## 2021-08-23 DIAGNOSIS — O24419 Gestational diabetes mellitus in pregnancy, unspecified control: Principal | ICD-10-CM | POA: Diagnosis present

## 2021-08-23 LAB — COMPREHENSIVE METABOLIC PANEL
ALT: 18 U/L (ref 0–44)
AST: 27 U/L (ref 15–41)
Albumin: 3.3 g/dL — ABNORMAL LOW (ref 3.5–5.0)
Alkaline Phosphatase: 159 U/L — ABNORMAL HIGH (ref 38–126)
Anion gap: 8 (ref 5–15)
BUN: 10 mg/dL (ref 6–20)
CO2: 19 mmol/L — ABNORMAL LOW (ref 22–32)
Calcium: 9.3 mg/dL (ref 8.9–10.3)
Chloride: 108 mmol/L (ref 98–111)
Creatinine, Ser: 0.47 mg/dL (ref 0.44–1.00)
GFR, Estimated: >60 mL/min (ref >60)
Glucose, Bld: 106 mg/dL — ABNORMAL HIGH (ref 70–99)
Potassium: 3.6 mmol/L (ref 3.5–5.1)
Sodium: 135 mmol/L (ref 135–145)
Total Bilirubin: 0.7 mg/dL (ref 0.3–1.2)
Total Protein: 6.7 g/dL (ref 6.5–8.1)

## 2021-08-23 LAB — CBC
HCT: 34.4 % — ABNORMAL LOW (ref 36.0–46.0)
Hemoglobin: 12 g/dL (ref 12.0–15.0)
MCH: 29.6 pg (ref 26.0–34.0)
MCHC: 34.9 g/dL (ref 30.0–36.0)
MCV: 84.9 fL (ref 80.0–100.0)
Platelets: 214 10*3/uL (ref 150–400)
RBC: 4.05 MIL/uL (ref 3.87–5.11)
RDW: 14.1 % (ref 11.5–15.5)
WBC: 8.5 10*3/uL (ref 4.0–10.5)
nRBC: 0 % (ref 0.0–0.2)

## 2021-08-23 LAB — TYPE AND SCREEN
ABO/RH(D): O POS
Antibody Screen: NEGATIVE

## 2021-08-23 LAB — PROTEIN / CREATININE RATIO, URINE
Creatinine, Urine: 12 mg/dL
Total Protein, Urine: <6 mg/dL

## 2021-08-23 LAB — HEMOGLOBIN A1C
Hgb A1c MFr Bld: 4.7 % — ABNORMAL LOW (ref 4.8–5.6)
Mean Plasma Glucose: 88.19 mg/dL

## 2021-08-23 LAB — GLUCOSE, CAPILLARY
Glucose-Capillary: 102 mg/dL — ABNORMAL HIGH (ref 70–99)
Glucose-Capillary: 95 mg/dL (ref 70–99)
Glucose-Capillary: 86 mg/dL (ref 70–99)

## 2021-08-23 MED ORDER — HYDRALAZINE HCL 20 MG/ML IJ SOLN: 10.0000 mg | INTRAMUSCULAR | Status: DC | PRN

## 2021-08-23 MED ORDER — MISOPROSTOL 25 MCG QUARTER TABLET
25.0000 ug | ORAL_TABLET | ORAL | Status: DC | PRN
  Administered 2021-08-23: 25 ug via ORAL
  Filled 2021-08-23 (×2): qty 1

## 2021-08-23 MED ORDER — LABETALOL HCL 5 MG/ML IV SOLN
80.0000 mg | INTRAVENOUS | Status: DC | PRN
  Filled 2021-08-23: qty 16

## 2021-08-23 MED ORDER — MISOPROSTOL 200 MCG PO TABS
ORAL_TABLET | ORAL | Status: AC
  Administered 2021-08-23: 25 ug via ORAL
  Filled 2021-08-23: qty 4

## 2021-08-23 MED ORDER — ONDANSETRON HCL 4 MG/2ML IJ SOLN: 4.0000 mg | Freq: Four times a day (QID) | INTRAMUSCULAR | Status: DC | PRN

## 2021-08-23 MED ORDER — LABETALOL HCL 5 MG/ML IV SOLN: 40.0000 mg | INTRAVENOUS | Status: DC | PRN

## 2021-08-23 MED ORDER — SODIUM CHLORIDE 0.9 % IV SOLN: INTRAVENOUS | Status: DC | PRN

## 2021-08-23 MED ORDER — TERBUTALINE SULFATE 1 MG/ML IJ SOLN: 0.2500 mg | Freq: Once | INTRAMUSCULAR | Status: DC | PRN

## 2021-08-23 MED ORDER — LACTATED RINGERS IV SOLN: INTRAVENOUS | Status: DC

## 2021-08-23 MED ORDER — LABETALOL HCL 5 MG/ML IV SOLN
20.0000 mg | INTRAVENOUS | Status: DC | PRN
  Administered 2021-08-23: 20 mg via INTRAVENOUS

## 2021-08-23 MED ORDER — BUPIVACAINE HCL (PF) 0.25 % IJ SOLN
INTRAMUSCULAR | Status: DC | PRN
  Administered 2021-08-23: 4 mL via EPIDURAL
  Administered 2021-08-23: 5 mL via EPIDURAL

## 2021-08-23 MED ORDER — LIDOCAINE HCL (PF) 1 % IJ SOLN
30.0000 mL | INTRAMUSCULAR | Status: DC | PRN
  Filled 2021-08-23: qty 30

## 2021-08-23 MED ORDER — FENTANYL-BUPIVACAINE-NACL 0.5-0.125-0.9 MG/250ML-% EP SOLN
EPIDURAL | Status: DC | PRN
  Administered 2021-08-23: 12 mL/h via EPIDURAL

## 2021-08-23 MED ORDER — ACETAMINOPHEN 325 MG PO TABS
650.0000 mg | ORAL_TABLET | ORAL | Status: DC | PRN
  Administered 2021-08-23 – 2021-08-24 (×2): 650 mg via ORAL
  Filled 2021-08-23 (×2): qty 2

## 2021-08-23 MED ORDER — OXYTOCIN 10 UNIT/ML IJ SOLN
INTRAMUSCULAR | Status: AC
  Filled 2021-08-23: qty 2

## 2021-08-23 MED ORDER — FENTANYL CITRATE (PF) 100 MCG/2ML IJ SOLN: 50.0000 ug | INTRAMUSCULAR | Status: DC | PRN

## 2021-08-23 MED ORDER — FENTANYL-BUPIVACAINE-NACL 0.5-0.125-0.9 MG/250ML-% EP SOLN
EPIDURAL | Status: AC
  Filled 2021-08-23: qty 250

## 2021-08-23 MED ORDER — LIDOCAINE-EPINEPHRINE (PF) 1.5 %-1:200000 IJ SOLN
INTRAMUSCULAR | Status: DC | PRN
  Administered 2021-08-23: 3 mL via EPIDURAL

## 2021-08-23 MED ORDER — OXYTOCIN-SODIUM CHLORIDE 30-0.9 UT/500ML-% IV SOLN
2.5000 [IU]/h | INTRAVENOUS | Status: DC
  Administered 2021-08-24: 2.5 [IU]/h via INTRAVENOUS
  Filled 2021-08-23: qty 500

## 2021-08-23 MED ORDER — SOD CITRATE-CITRIC ACID 500-334 MG/5ML PO SOLN: 30.0000 mL | ORAL | Status: DC | PRN

## 2021-08-23 MED ORDER — AMMONIA AROMATIC IN INHA
RESPIRATORY_TRACT | Status: AC
  Filled 2021-08-23: qty 10

## 2021-08-23 MED ORDER — OXYTOCIN-SODIUM CHLORIDE 30-0.9 UT/500ML-% IV SOLN
1.0000 m[IU]/min | INTRAVENOUS | Status: DC
  Administered 2021-08-23: 2 m[IU]/min via INTRAVENOUS

## 2021-08-23 MED ORDER — LIDOCAINE HCL (PF) 1 % IJ SOLN
INTRAMUSCULAR | Status: DC | PRN
  Administered 2021-08-23: 3 mL via SUBCUTANEOUS

## 2021-08-23 MED ORDER — MISOPROSTOL 25 MCG QUARTER TABLET
25.0000 ug | ORAL_TABLET | ORAL | Status: DC | PRN
  Administered 2021-08-23 (×2): 25 ug via VAGINAL
  Filled 2021-08-23 (×2): qty 1

## 2021-08-23 MED ORDER — OXYTOCIN BOLUS FROM INFUSION
333.0000 mL | Freq: Once | INTRAVENOUS | Status: AC
  Administered 2021-08-24: 333 mL via INTRAVENOUS

## 2021-08-23 MED ORDER — LACTATED RINGERS IV SOLN
500.0000 mL | INTRAVENOUS | Status: DC | PRN
  Administered 2021-08-23 – 2021-08-24 (×3): 250 mL via INTRAVENOUS

## 2021-08-23 NOTE — H&P (Signed)
OB History & Physical   History of Present Illness:  Chief Complaint:   HPI:  Stacy Madden is a 36 y.o. G2P1001 female at [redacted]w[redacted]d dated by LMP and c/w [place] at [redacted]w[redacted]d.  She presents to L&D for scheduled IOL due to [facility] on Metformin. Reports active FM, denies VB, LOF, feeling no UCs.    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    Maternal Medical History:   Past Medical History:  Diagnosis Date   Gestational diabetes    Medical history non-contributory     Past Surgical History:  Procedure Laterality Date   ORIF WRIST FRACTURE Left 09/07/2015   Procedure: OPEN REDUCTION INTERNAL FIXATION (ORIF) WRIST FRACTURE AND CARPAL TUNNEL RELEASE;  Surgeon: Jodi Geralds, MD;  Location: [facility] OR;  Service: Orthopedics;  Laterality: Left;    No Known Allergies  Prior to Admission medications   Medication Sig Start Date End Date Taking? Authorizing Provider  METFORMIN HCL PO Take by mouth.   Yes [provider]  Prenatal Vit-Fe Fumarate-FA (PRENATAL MULTIVITAMIN) TABS tablet Take 1 tablet by mouth daily at 12 noon.   Yes [provider]     Pren[facility] care site: [facility]  Social History: She  reports that she has never smoked. She has never used smokeless tobacco. She reports that she does not currently use alcohol. She reports that she does not use drugs.  Family History: family history is not on file.   Review of Systems: A full review of systems was performed and negative except as noted in the HPI.     Physical Exam:  Vital Signs: BP 139/87 (BP Location: Left Arm)   Pulse 83   Temp 97.7 F (36.5 C) (Oral)   Resp 16   Ht 5\' 4"  (1.626 m)   Wt 83.5 kg   LMP 11/20/2020   BMI 31.58 kg/m    Vitals:   08/23/21 0528 08/23/21 0614 08/23/21 0724  BP: (!) 133/90 (!) 137/92 139/87    General: no acute distress.  HEENT: normocephalic, atraumatic Heart: regular rate & rhythm.  No murmurs/rubs/gallops Lungs: clear to auscultation bilaterally, normal respiratory effort Abdomen: soft, gravid, non-tender;  EFW: 8lbs Pelvic:   External: Normal external female genitalia  Cervix: Dilation: Fingertip / Effacement (%): 50 / Station: Ballotable    Extremities: non-tender, symmetric, no edema bilaterally.  DTRs: 2+  Neurologic: Alert & oriented x 3.    Results for orders placed or performed during the hospital encounter of 08/23/21 (from the past 24 hour(s))  CBC     Status: Abnormal   Collection Time: 08/23/21  5:43 AM  Result Value Ref Range   WBC 8.5 4.0 - 10.5 K/uL   RBC 4.05 3.87 - 5.11 MIL/uL   Hemoglobin 12.0 12.0 - 15.0 g/dL   HCT 10/23/21 (L) 44.0 - 10.2 %   MCV 84.9 80.0 - 100.0 fL   MCH 29.6 26.0 - 34.0 pg   MCHC 34.9 30.0 - 36.0 g/dL   RDW 72.5 36.6 - 44.0 %   Platelets 214 150 - 400 K/uL   nRBC 0.0 0.0 - 0.2 %  Comprehensive metabolic panel     Status: Abnormal   Collection Time: 08/23/21  5:43 AM  Result Value  Ref Range   Sodium 135 135 - 145 mmol/L   Potassium 3.6 3.5 - 5.1 mmol/L   Chloride 108 98 - 111 mmol/L   CO2 19 (L) 22 - 32 mmol/L   Glucose, Bld 106 (H) 70 - 99 mg/dL   BUN 10 6 - 20 mg/dL   Creatinine, Ser 2.97 0.44 - 1.00 mg/dL   Calcium 9.3 8.9 - 98.9 mg/dL   Total Protein 6.7 6.5 - 8.1 g/dL   Albumin 3.3 (L) 3.5 - 5.0 g/dL   AST 27 15 - 41 U/L   ALT 18 0 - 44 U/L   Alkaline Phosphatase 159 (H) 38 - 126 U/L   Total Bilirubin 0.7 0.3 - 1.2 mg/dL   GFR, Estimated >21 >19 mL/min   Anion gap 8 5 - 15  Protein / creatinine ratio, urine     Status: None   Collection Time: 08/23/21  5:59 AM  Result Value Ref Range   Creatinine, Urine 12 mg/dL   Total Protein, Urine <6 mg/dL   Protein Creatinine Ratio        0.00 - 0.15 mg/mg[Cre]    Pertinent  Results:  Prenatal Labs: Blood type/Rh O Pos  Antibody screen neg  Rubella  NON-Immune  Varicella Immune  RPR NR  HBsAg Neg  HIV NR  GC neg  Chlamydia neg  Genetic screening Beta thalassemia minor  1 hour GTT  149  3 hour GTT  [phone]  GBS  Neg   08/19/21: MFM growth: 3571gm, 65%; AFI 9.4cm, posterior plac  FHT: 125bpm, mod var, + accels, no decels TOCO: Irreg, q4-106min SVE:  Dilation: Fingertip / Effacement (%): 50 / Station: Agricultural consultant by leopolds/ [place]   Assessment:  Stacy Madden is a 36 y.o. G2P102female at [redacted]w[redacted]d with IOL for GDMA2.   Plan:  1. Admit to Labor & Delivery; consents reviewed and obtained - notified Dr Feliberto Gottron of admission.   2. Fetal Well being  - Fetal Tracing: Cat I tracing - Group B Streptococcus ppx indicated: Negative - Presentation: cephalic confirmed by [place]   3. Routine OB: - Prenatal labs reviewed, as above - Rh O Pos - CBC, T&S, RPR on admit - Clear fluids, IVF  4. Induction of Labor -  Contractions: external toco in place -  Pelvis proven to 7#14 -  Plan for induction with cytotec -  Plan for continuous fetal monitoring  -  Maternal pain control as desired - Anticipate vaginal delivery  5. Post Partum Planning: - Infant feeding: breast and bottle - Contraception: TBD  Randa Ngo, CNM 08/23/21 8:02 AM

## 2021-08-23 NOTE — Progress Notes (Signed)
Labor Progress Note  Stacy Madden is a 36 y.o. G2P1001 at [redacted]w[redacted]d by LMP admitted for IOL due to [facility] on Metformin.  Subjective: comfortable with epidural  Objective: BP 117/71   Pulse 76   Temp 98.8 F (37.1 C) (Oral)   Resp 16   Ht 5\' 4"  (1.626 m)   Wt 83.5 kg   LMP 11/20/2020   SpO2 100%   BMI 31.58 kg/m  Notable VS details:  Vitals:   08/23/21 1527 08/23/21 1603 08/23/21 1614 08/23/21 1631  BP: 139/89 (!) 163/94 (!) 169/85 (!) 156/87   08/23/21 1800 08/23/21 1801 08/23/21 1815 08/23/21 1830  BP: 115/73 115/73 123/71 124/68   08/23/21 1845 08/23/21 1932 08/23/21 1945 08/23/21 2045  BP: 128/77 123/61 119/61 117/71     Fetal Assessment: FHT:  FHR: 125 bpm, variability: moderate,  accelerations:  Present,  decelerations:  Present variable and early Category/reactivity:  Category II UC:   regular, every 2-3 minutes, Pitocin at 76mu/min SVE:  6/80/-1, molding noted. No cervical change in 2hours - bloody show present - IUPC placed for titrating pitocin.   Membrane status: AROM at 1903 Amniotic color: clear  Labs: Lab Results  Component Value Date   WBC 8.5 08/23/2021   HGB 12.0 08/23/2021   HCT 34.4 (L) 08/23/2021   MCV 84.9 08/23/2021   PLT 214 08/23/2021    Assessment / Plan: Spontaneous labor, progressing normally GHTN   Labor: progressing after 2 doses of cytotec, cook cath and on Pitocin now. S/p AROM, no cervical change in last 2hours, IUPC placed.  Preeclampsia:  Labs WNL on admit, asymptomatic; severe range BP treated x 1,  If spikes severe range again, will need Mag sulfate per POC with Dr 10/23/2021.  Fetal Wellbeing:  Category II- early and variable decels, overall reassuring, will continue to monitor.  Pain Control:  Epidural I/D:  n/a Anticipated MOD:  NSVD  Feliberto Gottron Stacy Madden, CNM 08/23/2021, 11:45 PM

## 2021-08-23 NOTE — Anesthesia Preprocedure Evaluation (Signed)
Anesthesia Evaluation  Patient identified by MRN, date of birth, ID band Patient awake    Reviewed: Allergy & Precautions, H&P , NPO status , Patient's Chart, lab work & pertinent test results, reviewed documented beta blocker date and time   Airway Mallampati: II  TM Distance: >3 FB Neck ROM: full    Dental no notable dental hx. (+) Teeth Intact   Pulmonary    Pulmonary exam normal breath sounds clear to auscultation       Cardiovascular Exercise Tolerance: Good negative cardio ROS   Rhythm:regular Rate:Normal     Neuro/Psych negative neurological ROS  negative psych ROS   GI/Hepatic negative GI ROS, Neg liver ROS,   Endo/Other  negative endocrine ROSdiabetes, Gestational, Oral Hypoglycemic Agents  Renal/GU      Musculoskeletal   Abdominal   Peds  Hematology negative hematology ROS (+)   Anesthesia Other Findings   Reproductive/Obstetrics (+) Pregnancy                             Anesthesia Physical Anesthesia Plan  ASA: 2  Anesthesia Plan: Epidural   Post-op Pain Management:    Induction:   PONV Risk Score and Plan:   Airway Management Planned:   Additional Equipment:   Intra-op Plan:   Post-operative Plan:   Informed Consent: I have reviewed the patients History and Physical, chart, labs and discussed the procedure including the risks, benefits and alternatives for the proposed anesthesia with the patient or authorized representative who has indicated his/her understanding and acceptance.       Plan Discussed with:   Anesthesia Plan Comments:         Anesthesia Quick Evaluation

## 2021-08-23 NOTE — Progress Notes (Signed)
Labor Progress Note  Stacy Madden is a 36 y.o. G2P1001 at [redacted]w[redacted]d by LMP admitted for IOL due to [facility] on Metformin.  Subjective: more cramping and discomfort; denies HA, VD or RUQ pain.   Objective: BP (!) 163/94   Pulse 80   Temp 98.8 F (37.1 C) (Oral)   Resp 16   Ht 5\' 4"  (1.626 m)   Wt 83.5 kg   LMP 11/20/2020   BMI 31.58 kg/m  Notable VS details:  Vitals:   08/23/21 0528 08/23/21 0614 08/23/21 0724 08/23/21 0802  BP: (!) 133/90 (!) 137/92 139/87 (!) 141/89   08/23/21 0908 08/23/21 1012 08/23/21 1108 08/23/21 1204  BP: 133/87 120/63 126/69 129/82   08/23/21 1313 08/23/21 1440 08/23/21 1527 08/23/21 1603  BP: (!) 140/82 136/77 139/89 (!) 163/94     Fetal Assessment: FHT:  FHR: 135 bpm, variability: moderate,  accelerations:  Present,  decelerations:  Absent Category/reactivity:  Category I UC:   regular, every 2-3 minutes SVE:   2/70/-1, soft, midposition.  - Cook Cath placed, 78ml each uterine and vaginal balloon.   Membrane status: intact Amniotic color: n/a  Labs: Lab Results  Component Value Date   WBC 8.5 08/23/2021   HGB 12.0 08/23/2021   HCT 34.4 (L) 08/23/2021   MCV 84.9 08/23/2021   PLT 214 08/23/2021    Assessment / Plan: Spontaneous labor, progressing normally GHTN- elevated BP on admit, now severe range x 2.   Labor: progressing after 2 doses of cytotec, cook cath in place. Considering epidural.  Preeclampsia:  Labs WNL on admit, asymptomatic; severe range BP now, pt in pain, will treat with IV labetalol. If spikes severe range again, will need Mag sulfate per POC with Dr 10/23/2021.  Fetal Wellbeing:  Category I Pain Control:  Labor support without medications I/D:  n/a Anticipated MOD:  NSVD  Feliberto Gottron, CNM 08/23/2021, 4:25 PM

## 2021-08-23 NOTE — Anesthesia Procedure Notes (Signed)
Epidural Patient location during procedure: OB End time: 08/23/2021 5:35 PM  Staffing Performed: anesthesiologist   Preanesthetic Checklist Completed: patient identified, IV checked, site marked, risks and benefits discussed, surgical consent, monitors and equipment checked, pre-op evaluation and timeout performed  Epidural Patient position: sitting Prep: Betadine Patient monitoring: heart rate, continuous pulse ox and blood pressure Approach: midline Location: L4-L5 Injection technique: LOR saline  Needle:  Needle type: Tuohy  Needle gauge: 17 G Needle length: 9 cm and 9 Needle insertion depth: 6 cm Catheter type: closed end flexible Catheter size: 19 Gauge Catheter at skin depth: 12 cm Test dose: negative and 1.5% lidocaine with Epi 1:200 K  Assessment Events: blood not aspirated, injection not painful, no injection resistance, no paresthesia and negative IV test  Additional Notes   Patient tolerated the insertion well without complications.Reason for block:procedure for pain

## 2021-08-23 NOTE — Progress Notes (Signed)
PT arrived for scheduled IOL at [redacted]w[redacted]d G2P1. Pt denies symptoms consistent with active vaginal bleeding or LOF.  Notified provider of patient's arrival and need for orders. Bonnell Public, CNM notified by phone. Provider stated orders will be placed. Will begin to admit patient' for IOL and review medical history.

## 2021-08-23 NOTE — Progress Notes (Signed)
Labor Progress Note  Stacy Madden is a 36 y.o. G2P1001 at [redacted]w[redacted]d by LMP admitted for IOL due to [facility].  Subjective: comfortable with epidural, Cook cath fell out around 1730  Objective: BP 123/61 (BP Location: Left Arm)   Pulse 90   Temp 98.8 F (37.1 C) (Oral)   Resp 16   Ht 5\' 4"  (1.626 m)   Wt 83.5 kg   LMP 11/20/2020   SpO2 99%   BMI 31.58 kg/m  Notable VS details:  Vitals:   08/23/21 1313 08/23/21 1440 08/23/21 1527 08/23/21 1603  BP: (!) 140/82 136/77 139/89 (!) 163/94   08/23/21 1614 08/23/21 1631 08/23/21 1800 08/23/21 1801  BP: (!) 169/85 (!) 156/87 115/73 115/73   08/23/21 1815 08/23/21 1830 08/23/21 1845 08/23/21 1932  BP: 123/71 124/68 128/77 123/61     Fetal Assessment: FHT:  FHR: 130 bpm, variability: moderate,  accelerations:  Present,  decelerations:  Absent Category/reactivity:  Category I UC:   regular, every 2-3 minutes, Pitocin at 74mu/min SVE:   5-6/70/-2, soft, midposition.  - AROM performed, small amount of bloody show noted, clear fluid  Membrane status: AROM at 1903 Amniotic color: clear  Labs: Lab Results  Component Value Date   WBC 8.5 08/23/2021   HGB 12.0 08/23/2021   HCT 34.4 (L) 08/23/2021   MCV 84.9 08/23/2021   PLT 214 08/23/2021    Assessment / Plan: Spontaneous labor, progressing normally GHTN   Labor: progressing after 2 doses of cytotec, cook cath and on Pitocin now.  Preeclampsia:  Labs WNL on admit, asymptomatic; severe range BP treated x 1,  If spikes severe range again, will need Mag sulfate per POC with Dr 10/23/2021.  Fetal Wellbeing:  Category I Pain Control:  Epidural I/D:  n/a Anticipated MOD:  NSVD  Feliberto Gottron, CNM 08/23/2021, 7:37 PM

## 2021-08-24 ENCOUNTER — Encounter: Payer: Self-pay | Admitting: Obstetrics and Gynecology

## 2021-08-24 LAB — GLUCOSE, CAPILLARY
Glucose-Capillary: 105 mg/dL — ABNORMAL HIGH (ref 70–99)
Glucose-Capillary: 85 mg/dL (ref 70–99)

## 2021-08-24 LAB — RPR: RPR Ser Ql: NONREACTIVE

## 2021-08-24 MED ORDER — AMPICILLIN-SULBACTAM SODIUM 3 (2-1) G IJ SOLR
3.0000 g | Freq: Four times a day (QID) | INTRAMUSCULAR | Status: DC
  Administered 2021-08-24: 3 g via INTRAVENOUS
  Filled 2021-08-24 (×2): qty 8

## 2021-08-24 MED ORDER — SODIUM CHLORIDE 0.9 % IV SOLN
3.0000 g | Freq: Four times a day (QID) | INTRAVENOUS | Status: AC
  Administered 2021-08-24: 3 g via INTRAVENOUS
  Filled 2021-08-24: qty 3

## 2021-08-24 MED ORDER — ONDANSETRON HCL 4 MG/2ML IJ SOLN: 4.0000 mg | INTRAMUSCULAR | Status: DC | PRN

## 2021-08-24 MED ORDER — SENNOSIDES-DOCUSATE SODIUM 8.6-50 MG PO TABS
2.0000 | ORAL_TABLET | Freq: Every day | ORAL | Status: DC
  Administered 2021-08-25: 2 via ORAL
  Filled 2021-08-24: qty 2

## 2021-08-24 MED ORDER — PRENATAL MULTIVITAMIN CH
1.0000 | ORAL_TABLET | Freq: Every day | ORAL | Status: DC
  Administered 2021-08-24 – 2021-08-25 (×2): 1 via ORAL
  Filled 2021-08-24 (×2): qty 1

## 2021-08-24 MED ORDER — DIBUCAINE (PERIANAL) 1 % EX OINT
1.0000 | TOPICAL_OINTMENT | CUTANEOUS | Status: DC | PRN
  Filled 2021-08-24: qty 28

## 2021-08-24 MED ORDER — BENZOCAINE-MENTHOL 20-0.5 % EX AERO
1.0000 | INHALATION_SPRAY | CUTANEOUS | Status: DC | PRN
  Administered 2021-08-24: 1 via TOPICAL
  Filled 2021-08-24: qty 56

## 2021-08-24 MED ORDER — MISOPROSTOL 200 MCG PO TABS
800.0000 ug | ORAL_TABLET | Freq: Once | ORAL | Status: AC
Start: 2021-08-24 — End: 2021-08-24
  Administered 2021-08-24: 800 ug via RECTAL

## 2021-08-24 MED ORDER — COCONUT OIL OIL: 1.0000 | TOPICAL_OIL | Status: DC | PRN

## 2021-08-24 MED ORDER — DIPHENHYDRAMINE HCL 25 MG PO CAPS: 25.0000 mg | ORAL_CAPSULE | Freq: Four times a day (QID) | ORAL | Status: DC | PRN

## 2021-08-24 MED ORDER — ACETAMINOPHEN 325 MG PO TABS
650.0000 mg | ORAL_TABLET | ORAL | Status: DC | PRN
  Administered 2021-08-24 – 2021-08-25 (×2): 650 mg via ORAL
  Filled 2021-08-24 (×2): qty 2

## 2021-08-24 MED ORDER — ZOLPIDEM TARTRATE 5 MG PO TABS: 5.0000 mg | ORAL_TABLET | Freq: Every evening | ORAL | Status: DC | PRN

## 2021-08-24 MED ORDER — SIMETHICONE 80 MG PO CHEW: 80.0000 mg | CHEWABLE_TABLET | ORAL | Status: DC | PRN

## 2021-08-24 MED ORDER — TETANUS-DIPHTH-ACELL PERTUSSIS 5-2.5-18.5 LF-MCG/0.5 IM SUSY: 0.5000 mL | PREFILLED_SYRINGE | Freq: Once | INTRAMUSCULAR | Status: DC

## 2021-08-24 MED ORDER — MEASLES, MUMPS & RUBELLA VAC IJ SOLR
0.5000 mL | INTRAMUSCULAR | Status: AC | PRN
  Administered 2021-08-25: 0.5 mL via SUBCUTANEOUS
  Filled 2021-08-24: qty 0.5

## 2021-08-24 MED ORDER — WITCH HAZEL-GLYCERIN EX PADS
1.0000 | MEDICATED_PAD | CUTANEOUS | Status: DC | PRN
  Administered 2021-08-24: 1 via TOPICAL
  Filled 2021-08-24: qty 100

## 2021-08-24 MED ORDER — IBUPROFEN 600 MG PO TABS
600.0000 mg | ORAL_TABLET | Freq: Four times a day (QID) | ORAL | Status: DC
  Administered 2021-08-24 – 2021-08-25 (×6): 600 mg via ORAL
  Filled 2021-08-24 (×6): qty 1

## 2021-08-24 MED ORDER — ONDANSETRON HCL 4 MG PO TABS: 4.0000 mg | ORAL_TABLET | ORAL | Status: DC | PRN

## 2021-08-24 NOTE — Discharge Summary (Signed)
Obstetrical Discharge Summary  Patient Name: Stacy Madden DOB: 10/01/1985 MRN: [id number]  Date of Admission: 08/23/2021 Date of Delivery: 08/24/21 Delivered by: Magda Kiel CNM Date of Discharge: 08/25/2021  Primary OB: Princella Ion  BPZ:WCHENID'P last menstrual period was 11/20/2020. 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   Admitting Diagnosis: GDMA2, 39wks Secondary Diagnosis: Patient Active Problem List   Diagnosis Date Noted   Gestational diabetes mellitus (GDM) in third trimester 08/23/2021   Gestational diabetes mellitus (GDM) controlled on oral hypoglycemic drug, antepartum 01/07/2018   Displaced fracture of distal end of left radius 09/07/2015    Augmentation: AROM, Pitocin, Cytotec, and Cook Cath Complications: Intrauterine Inflammation or infection (Chorioamniotis) Intrapartum complications/course: IOL for GDMA2, [facility] ripening [facility], Epidural for pain mgmt, AROM performed. Prolonged 2nd stage due to maternal effort with Cat II tracing due to variables. See delivery note Date of Delivery: 08/24/21 Delivered By: McVey CNM Delivery Type: spontaneous vaginal delivery Anesthesia: epidural Placenta: spontaneous Laceration: none Episiotomy: none Newborn Data: Live born female infant " Paulette" Birth Weight:  7#4 APGAR: 2, 5, 8  Newborn Delivery   Birth date/time:  Delivery type:       Postpartum Procedures:   Edinburgh:     08/24/2021   12:00 PM 01/08/2018    8:00 PM  Edinburgh Postnatal Depression Scale Screening Tool  I have been able to laugh and see the funny side of things. 0 0  I have looked forward with enjoyment to things. 0 0  I have blamed myself  unnecessarily when things went wrong. 0 0  I have been anxious or worried for no good reason. 1 2  I have felt scared or panicky for no good reason. 1 2  Things have been getting on top of me. 1 1  I have been so unhappy that I have had difficulty sleeping. 0 0  I have felt sad or miserable. 0 0  I have been so unhappy that I have been crying. 0 0  The thought of harming myself has occurred to me. 1 0  Edinburgh Postnatal Depression Scale Total 4 5      Post partum course:  Patient had an uncomplicated postpartum course.  By time of discharge on PPD#1, her pain was controlled on oral pain medications; she had appropriate lochia and was ambulating, voiding without difficulty and tolerating regular diet.  She was deemed stable for discharge to home.     Discharge Physical Exam:  BP 130/80 (BP Location: Right Arm)   Pulse 84   Temp 98.2 F (36.8 C) (Oral)   Resp 18   Ht 5' 4" (1.626 m)   Wt 83.5 kg   LMP 11/20/2020   SpO2 97%   Breastfeeding Unknown   BMI 31.58 kg/m   General: NAD CV: RRR Pulm: CTABL, nl effort ABD: s/nd/nt, fundus firm and below the umbilicus Lochia: moderate Perineum: well approximated/intact DVT Evaluation: LE non-ttp, no evidence of DVT on exam.  Hemoglobin  Date Value Ref Range Status  08/25/2021 10.2 (L) 12.0 - 15.0 g/dL Final   HCT  Date Value Ref Range Status  08/25/2021 29.1 (L) 36.0 - 46.0 % Final     Disposition: stable, discharge to home. Baby Feeding:  breastmilk and formula Baby Disposition: home with mom  Rh Immune globulin given: n/a Rubella vaccine given: NON-Immune, MMR given at discharge Varicella vaccine given: immune Tdap vaccine given in AP or PP setting:  Flu vaccine given in AP or PP setting:   Contraception: BTL, consents signed 08/25/21  Prenatal Labs:  Blood type/Rh O Pos  Antibody screen neg  Rubella  NON-Immune  Varicella Immune  RPR NR  HBsAg Neg  HIV NR  GC neg  Chlamydia neg  Genetic screening Beta  thalassemia minor  1 hour GTT  149  3 hour GTT  [phone]  GBS  Neg     Plan:  Stacy Madden was discharged to home in good condition. Follow-up appointment with delivering provider in 6 weeks.  Discharge Medications: Allergies as of 08/25/2021   No Known Allergies      Medication List     TAKE these medications    acetaminophen 325 MG tablet Commonly known as: Tylenol Take 2 tablets (650 mg total) by mouth every 4 (four) hours as needed (for pain scale < 4).   benzocaine-Menthol 20-0.5 % Aero Commonly known as: DERMOPLAST Apply 1 Application topically as needed for irritation (perineal discomfort).   coconut oil Oil Apply 1 Application topically as needed.   dibucaine 1 % Oint Commonly known as: NUPERCAINAL Place 1 Application rectally as needed for hemorrhoids.   ibuprofen 600 MG tablet Commonly known as: ADVIL Take 1 tablet (600 mg total) by mouth every 6 (six) hours.   prenatal multivitamin Tabs tablet Take 1 tablet by mouth daily at 12 noon.   senna-docusate 8.6-50 MG tablet Commonly known as: Senokot-S Take 2 tablets by mouth daily. Start taking on: August 26, 2021   simethicone 80 MG chewable tablet Commonly known as: MYLICON Chew 1 tablet (80 mg total) by mouth as needed for flatulence.   witch hazel-glycerin pad Commonly known as: TUCKS Apply 1 Application topically as needed for hemorrhoids.         Follow-up Information     Schermerhorn, Gwen Her, MD Follow up in 4 week(s).   Specialty: Obstetrics and Gynecology Why: Preop visit for BTL Contact information: [address] [place] [address] [place], [place] Follow up in 2 week(s).   Specialty: General Practice Why: mood check Contact information: [place]. [place] [address] [phone]                 Signed:  Ed Blalock,  [place] 08/25/2021  4:50 PM

## 2021-08-24 NOTE — Progress Notes (Signed)
Labor Progress Note  Stacy Madden is a 36 y.o. G2P1001 at [redacted]w[redacted]d by LMP admitted for IOL due to [facility] on Metformin.  Subjective: comfortable with epidural, feeling some pressure  Objective: BP 121/77   Pulse 95   Temp 98.3 F (36.8 C) (Oral)   Resp 16   Ht 5\' 4"  (1.626 m)   Wt 83.5 kg   LMP 11/20/2020   SpO2 98%   BMI 31.58 kg/m  Notable VS details:  Vitals:   08/23/21 1631 08/23/21 1800 08/23/21 1801 08/23/21 1815  BP: (!) 156/87 115/73 115/73 123/71   08/23/21 1830 08/23/21 1845 08/23/21 1932 08/23/21 1945  BP: 124/68 128/77 123/61 119/61   08/23/21 2045 08/23/21 2348 08/24/21 0045 08/24/21 0145  BP: 117/71 120/74 112/60 121/77     Fetal Assessment: FHT:  FHR: 130 bpm, variability: moderate,  accelerations:  Present,  decelerations:  Present variable and early  - FHR with recurrent variables with UCs  Category/reactivity:  Category II UC:   regular, every 2-3 minutes, Pitocin restarted, at 99mu/min  SVE:  C/C/+1 molding noted. Suspect OP presentation,   Membrane status: AROM at 1903 Amniotic color: clear  Labs: Lab Results  Component Value Date   WBC 8.5 08/23/2021   HGB 12.0 08/23/2021   HCT 34.4 (L) 08/23/2021   MCV 84.9 08/23/2021   PLT 214 08/23/2021    Assessment / Plan: Spontaneous labor, progressing normally GHTN   Labor: 2 doses of cytotec, cook cath and Pitocin, now second stage, x 1 hour. Pushing effort poor to fair, decreased epidural; coached on position changes and pushing. Will continue.   Fetal Wellbeing:  Category II- variable decels, will continue to monitor.    10/23/2021, CNM 08/24/2021, 4:23 AM

## 2021-08-24 NOTE — Progress Notes (Signed)
Resumed pushing at 0535; temp 101.1; suspected Chorio- rx Unasyn 3gm IVPB ordered.   Pushing well now, fetal station +2 to +3 with push.   Cat II tracing with mod variability and variable decels.   Randa Ngo, CNM 08/24/2021 5:54 AM

## 2021-08-24 NOTE — Progress Notes (Signed)
Labor Progress Note  Stacy Madden is a 36 y.o. G2P1001 at [redacted]w[redacted]d by LMP admitted for IOL due to [facility] on Metformin.  Subjective: comfortable with epidural; CNM called to bedside for FHR 90s  Objective: BP 121/77   Pulse 95   Temp 98.3 F (36.8 C) (Oral)   Resp 16   Ht 5\' 4"  (1.626 m)   Wt 83.5 kg   LMP 11/20/2020   SpO2 98%   BMI 31.58 kg/m  Notable VS details:  Vitals:   08/23/21 1631 08/23/21 1800 08/23/21 1801 08/23/21 1815  BP: (!) 156/87 115/73 115/73 123/71   08/23/21 1830 08/23/21 1845 08/23/21 1932 08/23/21 1945  BP: 124/68 128/77 123/61 119/61   08/23/21 2045 08/23/21 2348 08/24/21 0045 08/24/21 0145  BP: 117/71 120/74 112/60 121/77     Fetal Assessment: FHT:  FHR: 115 bpm, variability: moderate,  accelerations:  Abscent,  decelerations:  Present variable and early  - FHR with recurrent variables with UCs,  Category/reactivity:  Category II UC:   regular, every 2-3 minutes, Pitocin at 94mu/min SVE:  C/C/+1 molding noted.  - IUPC fallen out per nursing, but adequate MVUs prior to that.   Membrane status: AROM at 1903 Amniotic color: clear  Labs: Lab Results  Component Value Date   WBC 8.5 08/23/2021   HGB 12.0 08/23/2021   HCT 34.4 (L) 08/23/2021   MCV 84.9 08/23/2021   PLT 214 08/23/2021    Assessment / Plan: Spontaneous labor, progressing normally GHTN   Labor: 2 doses of cytotec, cook cath and Pitocin, now second stage, will begin to push. Monitoring FHR closely, baseline change to 110-115bpm, mod variability.  Preeclampsia:  Labs WNL on admit, asymptomatic; severe range BP treated x 1,  If spikes severe range again, will need Mag sulfate per POC with Dr 10/23/2021.  Fetal Wellbeing:  Category II- variable decels, will continue to monitor.  Pain Control:  Epidural I/D:  n/a Anticipated MOD:  NSVD  Feliberto Gottron Avree Szczygiel, CNM 08/24/2021, 3:26 AM

## 2021-08-24 NOTE — Lactation Note (Signed)
This note was copied from a baby's chart. Lactation Consultation Note  Patient Name: Girl Assia Meanor VHQIO'N Date: 08/24/2021 Reason for consult: Initial assessment;Term (difficult transition) Age:34 hours  Maternal Data Has patient been taught Hand Expression?: Yes Does the patient have breastfeeding experience prior to this delivery?: Yes How long did the patient breastfeed?: minimal ("she didn't like it")  G2, SVD 8 hours ago. Mom had minimal BF experience with first child, chooses BF/Bo with this baby.   Feeding Mother's Current Feeding Choice: Breast Milk and Formula  Baby had a difficult transition post delivery with CPAP and deep suctioning. Now doing well, has BF, voided and stooled.  LATCH Score   Baby sleeping during this visit.  Lactation Tools Discussed/Used    Interventions Interventions: Breast feeding basics reviewed;Hand express;Education (Early newborn feeding education and expectations, frequent attempts, skin to skin, hand expression, normal newborn feeding patterns/behaviors, early cues and feeding on demand)  Encouraged mom to call out for BF support as needed to help her with her BF journey. Encouraged to ask questions, have lactation team present for feedings, and briefly reviewed what to expect in the first days.  Discharge WIC Program: Yes  Consult Status Consult Status: Follow-up    Danford Bad 08/24/2021, 3:30 PM

## 2021-08-25 LAB — CBC
HCT: 29.1 % — ABNORMAL LOW (ref 36.0–46.0)
Hemoglobin: 10.2 g/dL — ABNORMAL LOW (ref 12.0–15.0)
MCH: 29.9 pg (ref 26.0–34.0)
MCHC: 35.1 g/dL (ref 30.0–36.0)
MCV: 85.3 fL (ref 80.0–100.0)
Platelets: 167 10*3/uL (ref 150–400)
RBC: 3.41 MIL/uL — ABNORMAL LOW (ref 3.87–5.11)
RDW: 14.5 % (ref 11.5–15.5)
WBC: 13.2 10*3/uL — ABNORMAL HIGH (ref 4.0–10.5)
nRBC: 0 % (ref 0.0–0.2)

## 2021-08-25 LAB — GLUCOSE, CAPILLARY: Glucose-Capillary: 80 mg/dL (ref 70–99)

## 2021-08-25 MED ORDER — BENZOCAINE-MENTHOL 20-0.5 % EX AERO: 1.0000 | INHALATION_SPRAY | CUTANEOUS | Status: AC | PRN

## 2021-08-25 MED ORDER — COCONUT OIL OIL: 1.0000 | TOPICAL_OIL | 0 refills | Status: AC | PRN

## 2021-08-25 MED ORDER — ACETAMINOPHEN 325 MG PO TABS: 650.0000 mg | ORAL_TABLET | ORAL | Status: AC | PRN

## 2021-08-25 MED ORDER — SENNOSIDES-DOCUSATE SODIUM 8.6-50 MG PO TABS: 2.0000 | ORAL_TABLET | Freq: Every day | ORAL | Status: AC

## 2021-08-25 MED ORDER — WITCH HAZEL-GLYCERIN EX PADS: 1.0000 | MEDICATED_PAD | CUTANEOUS | 12 refills | Status: AC | PRN

## 2021-08-25 MED ORDER — IBUPROFEN 600 MG PO TABS: 600.0000 mg | ORAL_TABLET | Freq: Four times a day (QID) | ORAL | 0 refills | Status: AC

## 2021-08-25 MED ORDER — DIBUCAINE (PERIANAL) 1 % EX OINT: 1.0000 | TOPICAL_OINTMENT | CUTANEOUS | Status: AC | PRN

## 2021-08-25 MED ORDER — SIMETHICONE 80 MG PO CHEW: 80.0000 mg | CHEWABLE_TABLET | ORAL | 0 refills | Status: AC | PRN

## 2021-08-25 NOTE — Anesthesia Postprocedure Evaluation (Signed)
Anesthesia Post Note  Patient: Engineer, structural  Procedure(s) Performed: AN AD HOC LABOR EPIDURAL  Patient location during evaluation: Mother Baby Anesthesia Type: Epidural Level of consciousness: awake, oriented and awake and alert Pain management: pain level controlled Vital Signs Assessment: post-procedure vital signs reviewed and stable Respiratory status: spontaneous breathing, respiratory function stable and nonlabored ventilation Cardiovascular status: blood pressure returned to baseline and stable Postop Assessment: no headache and no backache Anesthetic complications: no   No notable events documented.   Last Vitals:  Vitals:   08/25/21 0821 08/25/21 1512  BP: 130/83 130/80  Pulse: 71 84  Resp: 16 18  Temp: 36.6 C 36.8 C  SpO2: 100% 97%    Last Pain:  Vitals:   08/25/21 1512  TempSrc: Oral  PainSc:                  Stacy Madden

## 2021-08-25 NOTE — TOC Initial Note (Signed)
Transition of Care [facility]) - Initial/Assessment Note    Patient Details  Name: Stacy Madden MRN: [id number] Date of Birth: 01-09-1986  Transition of Care [facility]) CM/SW Contact:    Shelbie Hutching, RN Phone Number: 08/25/2021, 12:22 PM  Clinical Narrative:                 [facility] consult received for Pt answered "hardly ever" on question about self harm on the Edinburgh Postnatal Depression Scale.  RNCm met with patient at the beside, patient's husband is also present, Stacy Madden.  Infant is [employer].  They have a 73 1/2 year old daughter at home, Stacy Madden.  Patient reports that at this time she does not feel sad or like she wants to hurt herself, when she was feeling depressed it was about a month ago and her husband was working out of town.  She says that she has good support at home and did not experience any postpartum depression with her first daughter.  Reviewed signs and symptoms of postpartum depression and encouraged her to talk with her doctor if experiencing any of them.  Patient and her husband are excited to be going home with new baby, they have everything needed, home prepared, pediatrician appointment scheduled with [facility] for tomorrow at 11 am.           Patient Goals and CMS Choice        Expected Discharge Plan and Services                                                Prior Living Arrangements/Services                       Activities of Daily Living Home Assistive Devices/Equipment: None ADL Screening (condition at time of admission) Patient's cognitive ability adequate to safely complete daily activities?: Yes Is the patient deaf or have difficulty hearing?: No Does the patient have difficulty seeing, even when wearing glasses/contacts?: No Does the patient have difficulty concentrating, remembering, or making decisions?: No Patient able to express need for assistance with ADLs?: Yes Does the patient  have difficulty dressing or bathing?: No Independently performs ADLs?: Yes (appropriate for developmental age) Does the patient have difficulty walking or climbing stairs?: No Weakness of Legs: None Weakness of Arms/Hands: None  Permission Sought/Granted                  Emotional Assessment              Admission diagnosis:  Gestational diabetes mellitus (GDM) in third trimester [O24.419] Patient Active Problem List   Diagnosis Date Noted   Gestational diabetes mellitus (GDM) in third trimester 08/23/2021   Gestational diabetes mellitus (GDM) controlled on oral hypoglycemic drug, antepartum 01/07/2018   Displaced fracture of distal end of left radius 09/07/2015   PCP:  [place]:   [place] (N), [address] [place]) [place] [address] Phone: [phone] Fax: [phone]     Social Determinants of Health (SDOH) Interventions    Readmission Risk Interventions     No data to display

## 2021-08-26 LAB — SURGICAL PATHOLOGY

## 2022-04-05 IMAGING — CT CT HEAD W/O CM
3 series · 16 of 47 positions shown, 19 images · non-contrast
Comparison: None.

CLINICAL DATA: Head injury

EXAM:
CT HEAD WITHOUT CONTRAST
TECHNIQUE: Contiguous axial images were obtained from the base of the skull
through the vertex without intravenous contrast.

[Series 2: head wo · axial · 0.43mm/px · z∈[-208,-78]mm · 10 of 32 slices shown, 13 images]
[im 3/32  brain]
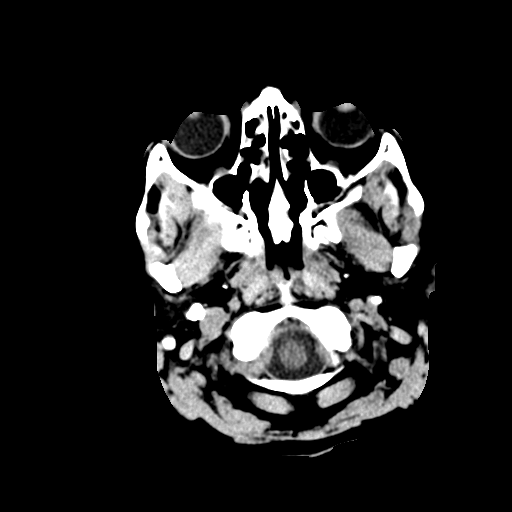
[im 3/32  bone]
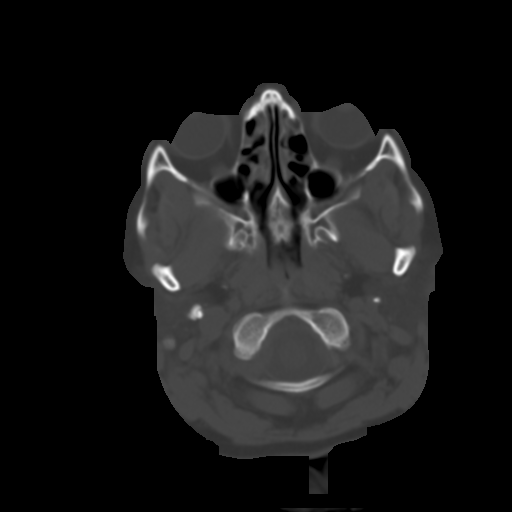
[im 6/32  brain]
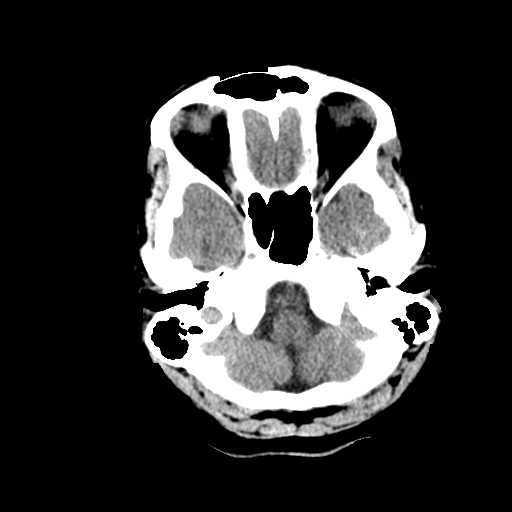
[im 9/32  brain]
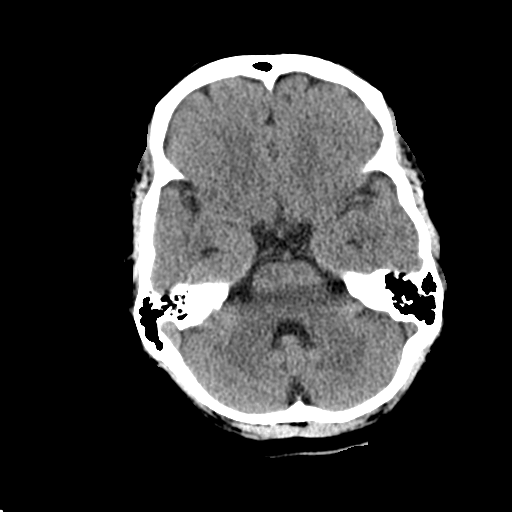
[im 11/32  brain]
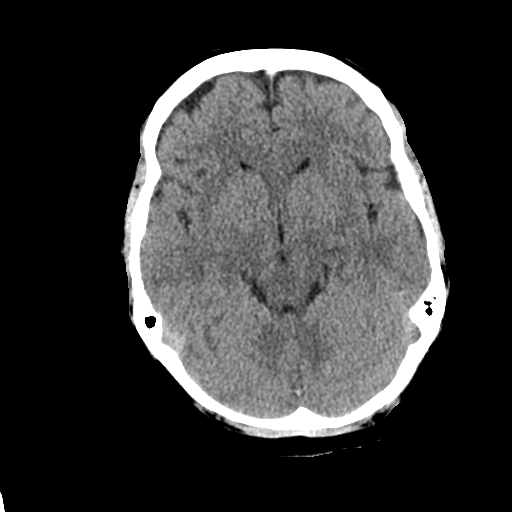
[im 14/32  brain]
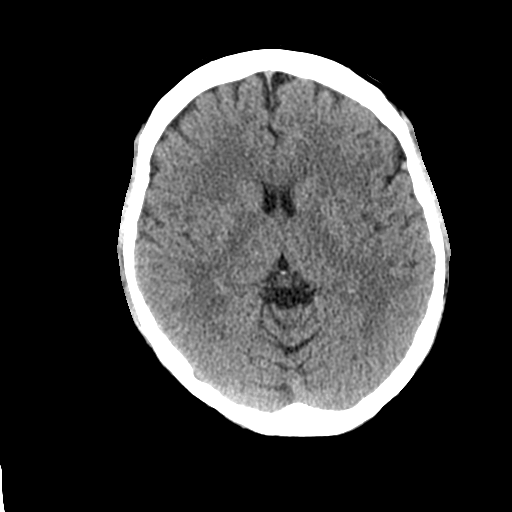
[im 14/32  bone]
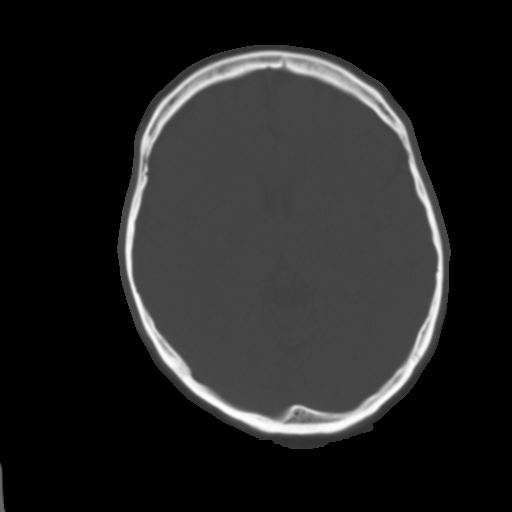
[im 18/32  brain]
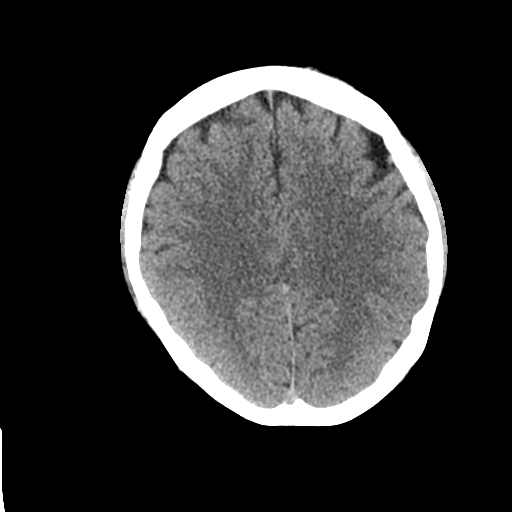
[im 21/32  brain]
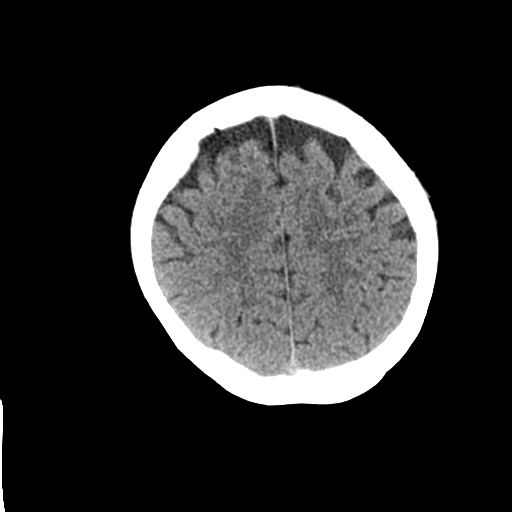
[im 24/32  brain]
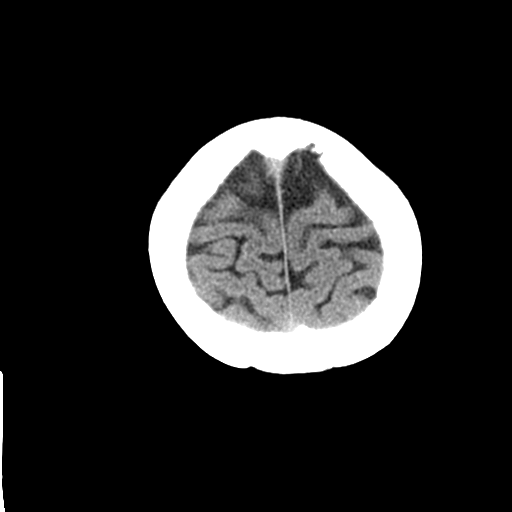
[im 26/32  brain]
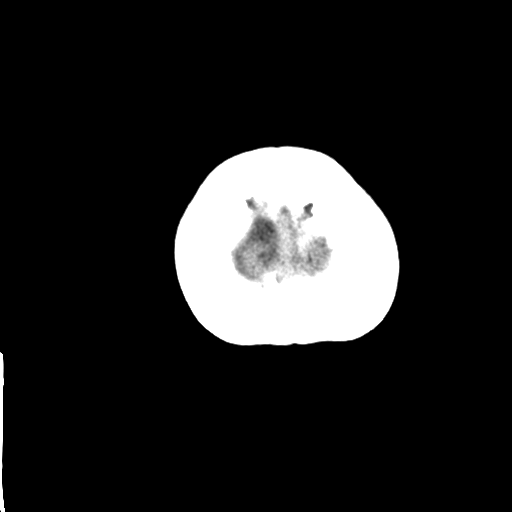
[im 26/32  bone]
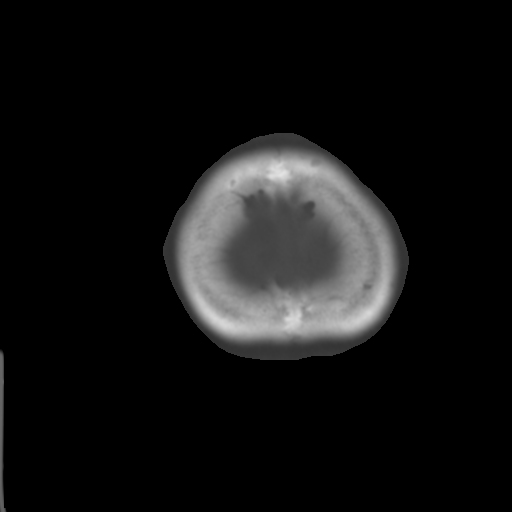
[im 29/32  brain]
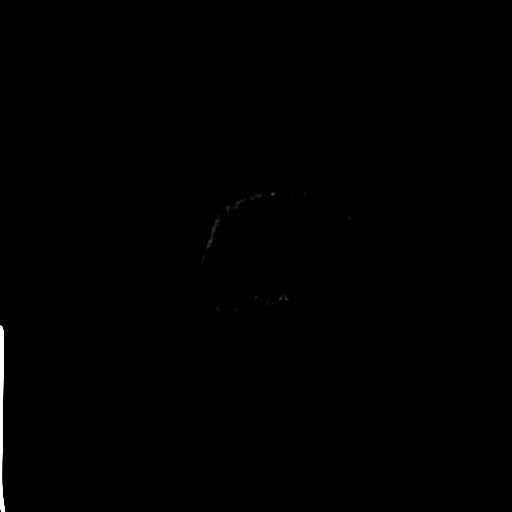

[Series 4: coronal soft tissue · coronal · 0.33mm/px · 3 of 67 slices shown]
[im 23/67  brain]
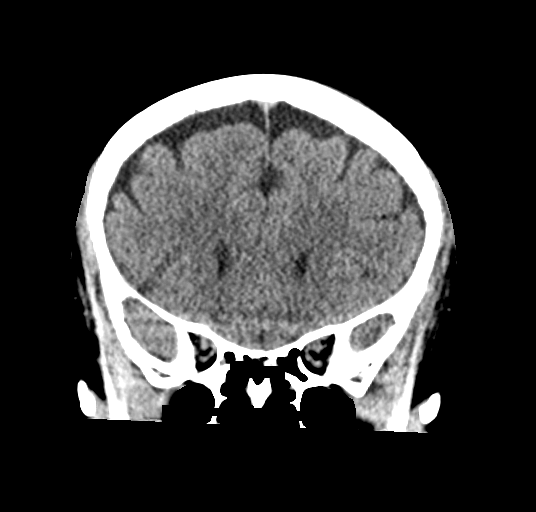
[im 30/67  brain]
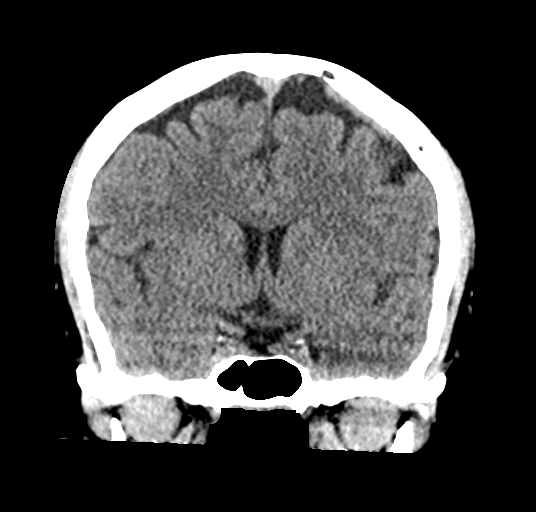
[im 37/67  brain]
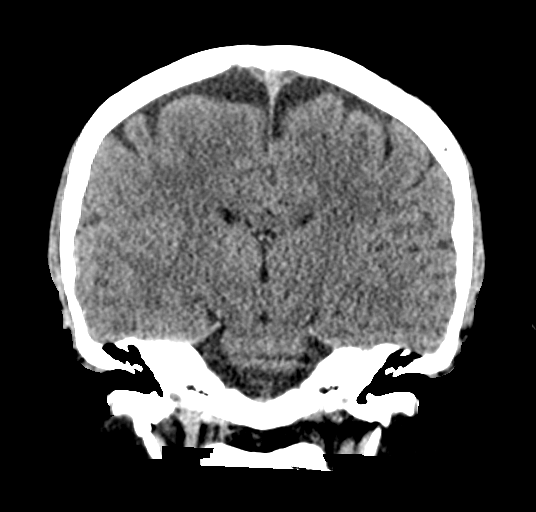

[Series 5: sagittal soft tissue · sagittal · 0.34mm/px · 3 of 57 slices shown]
[im 19/57  brain]
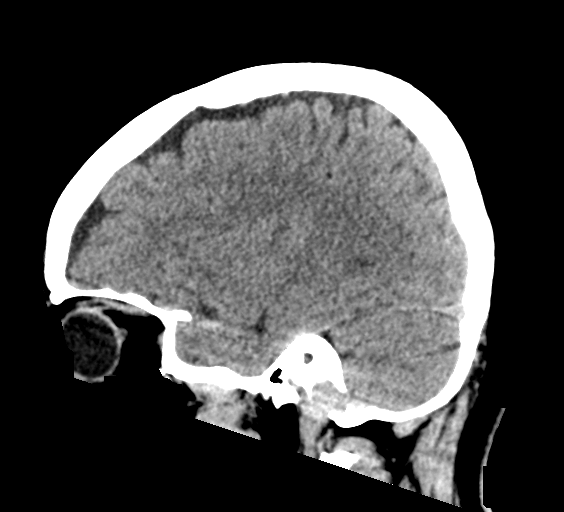
[im 29/57  brain]
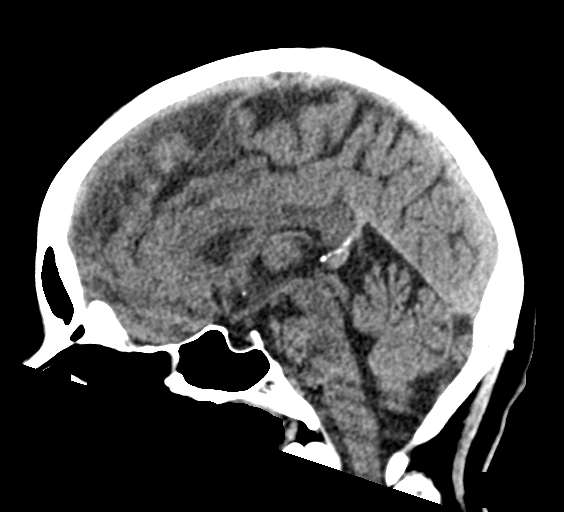
[im 38/57  brain]
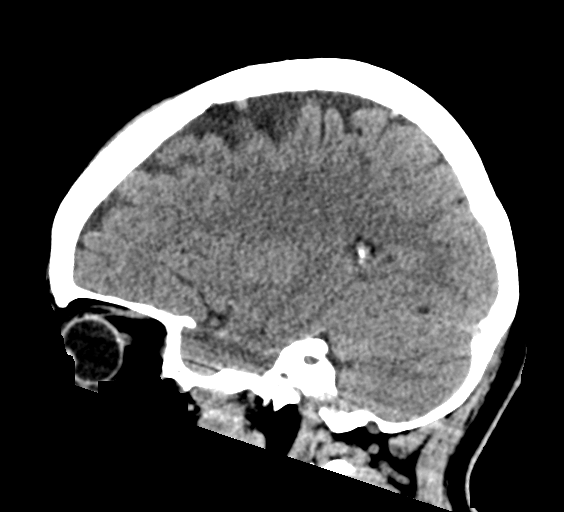

[16 of 47 positions shown; findings below may reference images not displayed]

FINDINGS: Brain: No evidence of acute infarction, hemorrhage, hydrocephalus,
extra-axial collection or mass lesion/mass effect.

Vascular: Negative for hyperdense vessel

Skull: Negative for skull fracture

Sinuses/Orbits: Mild mucosal edema paranasal sinuses. Negative orbit

Other: Laceration and soft tissue swelling left frontal scalp.
IMPRESSION: No acute intracranial abnormality.  Left frontal scalp laceration.

## 2023-12-17 ENCOUNTER — Emergency Department

## 2023-12-17 ENCOUNTER — Encounter: Payer: Self-pay | Admitting: *Deleted

## 2023-12-17 ENCOUNTER — Other Ambulatory Visit: Payer: Self-pay

## 2023-12-17 DIAGNOSIS — S82045A Nondisplaced comminuted fracture of left patella, initial encounter for closed fracture: Secondary | ICD-10-CM | POA: Insufficient documentation

## 2023-12-17 DIAGNOSIS — S8992XA Unspecified injury of left lower leg, initial encounter: Secondary | ICD-10-CM | POA: Diagnosis present

## 2023-12-17 DIAGNOSIS — M7989 Other specified soft tissue disorders: Secondary | ICD-10-CM | POA: Diagnosis not present

## 2023-12-17 DIAGNOSIS — W010XXA Fall on same level from slipping, tripping and stumbling without subsequent striking against object, initial encounter: Secondary | ICD-10-CM | POA: Insufficient documentation

## 2023-12-17 NOTE — ED Triage Notes (Signed)
 Pt reports that she slipped and fell on ceramic flooring, all of her weight went onto her left knee. Occurred 1 hour ago.She c/o left knee pain.

## 2023-12-18 ENCOUNTER — Telehealth: Payer: Self-pay | Admitting: Emergency Medicine

## 2023-12-18 ENCOUNTER — Emergency Department
Admission: EM | Admit: 2023-12-18 | Discharge: 2023-12-18 | Disposition: A | Discharge status: Home / Self Care | Attending: Emergency Medicine | Admitting: Emergency Medicine

## 2023-12-18 DIAGNOSIS — S82002A Unspecified fracture of left patella, initial encounter for closed fracture: Secondary | ICD-10-CM

## 2023-12-18 DIAGNOSIS — W19XXXA Unspecified fall, initial encounter: Secondary | ICD-10-CM

## 2023-12-18 MED ORDER — OXYCODONE-ACETAMINOPHEN 5-325 MG PO TABS
2.0000 | ORAL_TABLET | Freq: Once | ORAL | Status: AC
  Administered 2023-12-18: 2 via ORAL
  Filled 2023-12-18: qty 2

## 2023-12-18 MED ORDER — HYDROCODONE-ACETAMINOPHEN 5-325 MG PO TABS: 2.0000 | ORAL_TABLET | Freq: Four times a day (QID) | ORAL | 0 refills | Status: DC | PRN

## 2023-12-18 MED ORDER — HYDROCODONE-ACETAMINOPHEN 5-325 MG PO TABS: 1.0000 | ORAL_TABLET | Freq: Three times a day (TID) | ORAL | 0 refills | Status: AC | PRN

## 2023-12-18 NOTE — ED Provider Notes (Signed)
 [facility] Provider Note    Event Date/Time   First MD Initiated Contact with Patient 12/18/23 [phone]     (approximate)   History   Fall   HPI Stacy Madden is a 38 y.o. female who presents for evaluation of left knee pain.  She fell when she slipped on a wet floor and landed directly onto her left knee.  She has pain with any amount of movement.  She said that she hurt her right hand and her right knee a little bit but they are fine.  Most of the pain is in the left knee.  There is a little bit of swelling but mostly pain when she tries to move it.  She did not strike her head and did not lose consciousness.     Physical Exam   Triage Vital Signs: ED Triage Vitals  Encounter Vitals Group     BP 12/17/23 2331 (!) 132/93     Girls Systolic BP Percentile --      Girls Diastolic BP Percentile --      Boys Systolic BP Percentile --      Boys Diastolic BP Percentile --      Pulse Rate 12/17/23 2331 90     Resp 12/17/23 2331 18     Temp 12/17/23 2331 98.3 F (36.8 C)     Temp Source 12/17/23 2331 Oral     SpO2 12/17/23 2331 99 %     Weight 12/17/23 2332 80.7 kg (178 lb)     Height 12/17/23 2332 1.626 m (5' 4)     Head Circumference --      Peak Flow --      Pain Score 12/17/23 2336 3     Pain Loc --      Pain Education --      Exclude from Growth Chart --     Most recent vital signs: Vitals:   12/17/23 2331  BP: (!) 132/93  Pulse: 90  Resp: 18  Temp: 98.3 F (36.8 C)  SpO2: 99%    General: Awake, alert, pleasant and conversant, appears uncomfortable but nontoxic. CV:  Good peripheral perfusion.  Resp:  Normal effort. Speaking easily and comfortably, no accessory muscle usage nor intercostal retractions.   Abd:  No distention.  Other:  Minimal swelling to the left knee but pain and tenderness with any amount of palpation or movement.   ED Results / Procedures / Treatments   Labs (all labs ordered are listed, but only abnormal  results are displayed) Labs Reviewed - No data to display    RADIOLOGY I independently viewed and interpreted the patient's knee x-rays and she has a inferior patellar fracture.  Radiology confirmed the fracture and pointed out that it is nondisplaced.   PROCEDURES:  Critical Care performed: No  Procedures    IMPRESSION / MDM / ASSESSMENT AND PLAN / ED COURSE  I reviewed the triage vital signs and the nursing notes.                              Differential diagnosis includes, but is not limited to, fracture, contusion, dislocation  Patient's presentation is most consistent with acute presentation with potential threat to life or bodily function.  Labs/studies ordered: Left knee x-rays  Interventions/Medications given:  Medications  oxyCODONE -acetaminophen  (PERCOCET/ROXICET) 5-325 MG per tablet 2 tablet (has no administration in time range)    (Note:  hospital course  my include additional interventions and/or labs/studies not listed above.)   Nondisplaced patellar fracture, appropriate for treatment with knee immobilization.  I am providing a knee immobilizer, crutches, pain medication, and strongly recommended to her and her family member that she follow-up with orthopedics to ensure proper healing.  Patient understands and agrees.  I provided a work note but stressed to her that she needs to follow-up with Ortho for additional long-term recommendations as well       FINAL CLINICAL IMPRESSION(S) / ED DIAGNOSES   Final diagnoses:  Fall, initial encounter  Closed nondisplaced fracture of left patella, unspecified fracture morphology, initial encounter     Rx / DC Orders   ED Discharge Orders          Ordered    HYDROcodone -acetaminophen  (NORCO/VICODIN) 5-325 MG tablet  Every 6 hours PRN,   Status:  Discontinued        12/18/23 0116    HYDROcodone -acetaminophen  (NORCO/VICODIN) 5-325 MG tablet  Every 6 hours PRN        12/18/23 0118             Note:   This document was prepared using Dragon voice recognition software and may include unintentional dictation errors.   Gordan Huxley, MD 12/18/23 [phone]

## 2023-12-18 NOTE — Telephone Encounter (Signed)
 Received call from pharmacy that patient is opioid naive, written for 2 5 mg norco q6h. As patient is opiate nave, requests this be changed to 1 tab q8 hours. New rx sent.

## 2023-12-18 NOTE — Discharge Instructions (Addendum)
 Please try not to bear weight on your left leg is much as possible.  Read through the included information about how to use your knee immobilizer and crutches.  You can use over-the-counter ibuprofen  and Tylenol  according to label instructions. Take Norco as prescribed for severe pain. Do not drink alcohol, drive or participate in any other potentially dangerous activities while taking this medication as it may make you sleepy. Do not take this medication with any other sedating medications, either prescription or over-the-counter. If you were prescribed Percocet or Vicodin, do not take these with acetaminophen  (Tylenol ) as it is already contained within these medications.   This medication is an opiate (or narcotic) pain medication and can be habit forming.  Use it as little as possible to achieve adequate pain control.  Do not use or use it with extreme caution if you have a history of opiate abuse or dependence.  If you are on a pain contract with your primary care doctor or a pain specialist, be sure to let them know you were prescribed this medication today from the [facility].  This medication is intended for your use only - do not give any to anyone else and keep it in a secure place where nobody else, especially children, have access to it.  It will also cause or worsen constipation, so you may want to consider taking an over-the-counter stool softener while you are taking this medication.
